# Patient Record
Sex: Male | Born: 1985
Health system: Southern US, Community
[De-identification: ages and names within clinical notes are randomized; demographics above are authoritative.]

## PROBLEM LIST (undated history)

## (undated) ENCOUNTER — Emergency Department (HOSPITAL_COMMUNITY): Disposition: A | Discharge status: Home / Self Care | Payer: BC Managed Care – PPO

## (undated) DIAGNOSIS — T7840XA Allergy, unspecified, initial encounter: Secondary | ICD-10-CM

## (undated) DIAGNOSIS — F32A Depression, unspecified: Secondary | ICD-10-CM

## (undated) DIAGNOSIS — F329 Major depressive disorder, single episode, unspecified: Secondary | ICD-10-CM

## (undated) DIAGNOSIS — F419 Anxiety disorder, unspecified: Secondary | ICD-10-CM

## (undated) HISTORY — DX: Allergy, unspecified, initial encounter: T78.40XA

---
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
## 2006-12-15 ENCOUNTER — Emergency Department (HOSPITAL_COMMUNITY): Admission: EM | Admit: 2006-12-15 | Discharge: 2006-12-15 | Payer: Self-pay | Admitting: Emergency Medicine

## 2006-12-30 ENCOUNTER — Emergency Department (HOSPITAL_COMMUNITY): Admission: EM | Admit: 2006-12-30 | Discharge: 2006-12-30 | Payer: Self-pay | Admitting: Emergency Medicine

## 2007-02-04 ENCOUNTER — Emergency Department (HOSPITAL_COMMUNITY): Admission: EM | Admit: 2007-02-04 | Discharge: 2007-02-04 | Payer: Self-pay | Admitting: Emergency Medicine

## 2011-07-20 ENCOUNTER — Ambulatory Visit (INDEPENDENT_AMBULATORY_CARE_PROVIDER_SITE_OTHER): Payer: BC Managed Care – PPO

## 2011-07-20 DIAGNOSIS — R0609 Other forms of dyspnea: Secondary | ICD-10-CM

## 2011-07-20 DIAGNOSIS — R079 Chest pain, unspecified: Secondary | ICD-10-CM

## 2011-07-20 DIAGNOSIS — R0989 Other specified symptoms and signs involving the circulatory and respiratory systems: Secondary | ICD-10-CM

## 2011-10-25 ENCOUNTER — Ambulatory Visit (INDEPENDENT_AMBULATORY_CARE_PROVIDER_SITE_OTHER): Payer: BC Managed Care – PPO | Admitting: Internal Medicine

## 2011-10-25 ENCOUNTER — Telehealth: Payer: Self-pay

## 2011-10-25 ENCOUNTER — Telehealth: Payer: Self-pay | Admitting: Family Medicine

## 2011-10-25 VITALS — BP 123/80 | HR 90 | Temp 99.0°F | Resp 16 | Ht 70.75 in | Wt 195.8 lb

## 2011-10-25 DIAGNOSIS — J039 Acute tonsillitis, unspecified: Secondary | ICD-10-CM

## 2011-10-25 DIAGNOSIS — J209 Acute bronchitis, unspecified: Secondary | ICD-10-CM

## 2011-10-25 DIAGNOSIS — Z789 Other specified health status: Secondary | ICD-10-CM

## 2011-10-25 DIAGNOSIS — J40 Bronchitis, not specified as acute or chronic: Secondary | ICD-10-CM

## 2011-10-25 DIAGNOSIS — J45909 Unspecified asthma, uncomplicated: Secondary | ICD-10-CM | POA: Insufficient documentation

## 2011-10-25 LAB — POCT RAPID STREP A (OFFICE): Rapid Strep A Screen: POSITIVE — AB

## 2011-10-25 MED ORDER — HYDROCODONE-ACETAMINOPHEN 7.5-500 MG/15ML PO SOLN: 5.0000 mL | Freq: Four times a day (QID) | ORAL | Status: AC | PRN

## 2011-10-25 MED ORDER — AZITHROMYCIN 500 MG PO TABS: 500.0000 mg | ORAL_TABLET | Freq: Every day | ORAL | Status: AC

## 2011-10-25 NOTE — Telephone Encounter (Signed)
Lortab elixir printed and not given to patient, rx called into [facility].

## 2011-10-25 NOTE — Progress Notes (Signed)
  Subjective:    Patient ID: Brad Davidson, male    DOB: 1986/01/29, 26 y.o.   MRN: [id number]  HPI Very ST, purulent disch and bleeding from tonsills Has cough and hx of asthma  Review of Systems See list    Objective:   Physical Exam Exudative hemorragic tonsillitis Lungs few rhonchi Results for orders placed in visit on 10/25/11  POCT RAPID STREP A (OFFICE)      Component Value Range   Rapid Strep A Screen Positive (*) Negative           Assessment & Plan:  Zithromax 500mg  Lortab elixir 6 oz

## 2011-10-25 NOTE — Patient Instructions (Signed)
Tonsillitis Tonsils are lumps of lymphoid tissues at the back of the throat. Each tonsil has 20 crevices (crypts). Tonsils help fight nose and throat infections and keep infection from spreading to other parts of the body for the first 18 months of life. Tonsillitis is an infection of the throat that causes the tonsils to become red, tender, and swollen. CAUSES Sudden and, if treated, temporary (acute) tonsillitis is usually caused by infection with streptococcal bacteria. Long lasting (chronic) tonsillitis occurs when the crypts of the tonsils become filled with pieces of food and bacteria, which makes it easy for the tonsils to become constantly infected. SYMPTOMS  Symptoms of tonsillitis include:  A sore throat.   White patches on the tonsils.   Fever.   Tiredness.  DIAGNOSIS Tonsillitis can be diagnosed through a physical exam. Diagnosis can be confirmed with the results of lab tests, including a throat culture. TREATMENT  The goals of tonsillitis treatment include the reduction of the severity and duration of symptoms, prevention of associated conditions, and prevention of disease transmission. Tonsillitis caused by bacteria can be treated with antibiotics. Usually, treatment with antibiotics is started before the cause of the tonsillitis is known. However, if it is determined that the cause is not bacterial, antibiotics will not treat the tonsillitis. If attacks of tonsillitis are severe and frequent, your caregiver may recommend surgery to remove the tonsils (tonsillectomy). HOME CARE INSTRUCTIONS   Rest as much as possible and get plenty of sleep.   Drink plenty of fluids. While the throat is very sore, eat soft foods or liquids, such as sherbet, soups, or instant breakfast drinks.   Eat frozen ice pops.   Older children and adults may gargle with a warm or cold liquid to help soothe the throat. Mix 1 teaspoon of salt in 1 cup of water.   Other family members who also develop a  sore throat or fever should have a medical exam or throat culture.   Only take over-the-counter or prescription medicines for pain, discomfort, or fever as directed by your caregiver.   If you are given antibiotics, take them as directed. Finish them even if you start to feel better.  SEEK MEDICAL CARE IF:   Your baby is older than 3 months with a rectal temperature of 100.5 F (38.1 C) or higher for more than 1 day.   Large, tender lumps develop in your neck.   A rash develops.   Green, yellow-brown, or bloody substance is coughed up.   You are unable to swallow liquids or food for 24 hours.   Your child is unable to swallow food or liquids for 12 hours.  SEEK IMMEDIATE MEDICAL CARE IF:   You develop any new symptoms such as vomiting, severe headache, stiff neck, chest pain, or trouble breathing or swallowing.   You have severe throat pain along with drooling or voice changes.   You have severe pain, unrelieved with recommended medications.   You are unable to fully open the mouth.   You develop redness, swelling, or severe pain anywhere in the neck.   You have a fever.   Your baby is older than 3 months with a rectal temperature of 102 F (38.9 C) or higher.   Your baby is 12 months old or younger with a rectal temperature of 100.4 F (38 C) or higher.  MAKE SURE YOU:   Understand these instructions.   Will watch your condition.   Will get help right away if you are not  watch your condition.   Will get help right away if you are not doing well or get worse.  Document Released: 05/02/2005 Document Revised: 07/12/2011 Document Reviewed: 09/28/2010  [employer] Patient Information 2012 [employer].

## 2011-11-08 ENCOUNTER — Emergency Department (HOSPITAL_COMMUNITY)
Admission: EM | Admit: 2011-11-08 | Discharge: 2011-11-09 | Disposition: A | Discharge status: Home / Self Care | Payer: BC Managed Care – PPO | Attending: Emergency Medicine | Admitting: Emergency Medicine

## 2011-11-08 ENCOUNTER — Emergency Department (HOSPITAL_COMMUNITY): Payer: BC Managed Care – PPO

## 2011-11-08 ENCOUNTER — Encounter (HOSPITAL_COMMUNITY): Payer: Self-pay | Admitting: *Deleted

## 2011-11-08 DIAGNOSIS — R0609 Other forms of dyspnea: Secondary | ICD-10-CM | POA: Insufficient documentation

## 2011-11-08 DIAGNOSIS — K5289 Other specified noninfective gastroenteritis and colitis: Secondary | ICD-10-CM | POA: Insufficient documentation

## 2011-11-08 DIAGNOSIS — R0602 Shortness of breath: Secondary | ICD-10-CM | POA: Insufficient documentation

## 2011-11-08 DIAGNOSIS — K5900 Constipation, unspecified: Secondary | ICD-10-CM | POA: Insufficient documentation

## 2011-11-08 DIAGNOSIS — J45909 Unspecified asthma, uncomplicated: Secondary | ICD-10-CM | POA: Insufficient documentation

## 2011-11-08 DIAGNOSIS — K529 Noninfective gastroenteritis and colitis, unspecified: Secondary | ICD-10-CM

## 2011-11-08 DIAGNOSIS — R0989 Other specified symptoms and signs involving the circulatory and respiratory systems: Secondary | ICD-10-CM | POA: Insufficient documentation

## 2011-11-08 DIAGNOSIS — R1110 Vomiting, unspecified: Secondary | ICD-10-CM | POA: Insufficient documentation

## 2011-11-08 LAB — CBC
MCHC: 34.7 g/dL (ref 30.0–36.0)
RBC: 5.45 MIL/uL (ref 4.22–5.81)
RDW: 12.7 % (ref 11.5–15.5)

## 2011-11-08 LAB — DIFFERENTIAL
Basophils Absolute: 0 10*3/uL (ref 0.0–0.1)
Eosinophils Absolute: 0.5 10*3/uL (ref 0.0–0.7)
Eosinophils Relative: 3 % (ref 0–5)
Monocytes Absolute: 0.8 10*3/uL (ref 0.1–1.0)
Monocytes Relative: 5 % (ref 3–12)
Neutro Abs: 12.6 10*3/uL — ABNORMAL HIGH (ref 1.7–7.7)
Neutrophils Relative %: 79 % — ABNORMAL HIGH (ref 43–77)

## 2011-11-08 LAB — COMPREHENSIVE METABOLIC PANEL
ALT: 26 U/L (ref 0–53)
AST: 20 U/L (ref 0–37)
Albumin: 4.5 g/dL (ref 3.5–5.2)
Alkaline Phosphatase: 95 U/L (ref 39–117)
CO2: 23 mEq/L (ref 19–32)
Chloride: 102 mEq/L (ref 96–112)
GFR calc Af Amer: >90 mL/min (ref >90)
GFR calc non Af Amer: >90 mL/min (ref >90)
Sodium: 137 mEq/L (ref 135–145)
Total Bilirubin: 0.3 mg/dL (ref 0.3–1.2)

## 2011-11-08 LAB — LIPASE, BLOOD: Lipase: 15 U/L (ref 11–59)

## 2011-11-08 MED ORDER — ONDANSETRON HCL 4 MG/2ML IJ SOLN
4.0000 mg | Freq: Once | INTRAMUSCULAR | Status: AC
  Administered 2011-11-08: 4 mg via INTRAVENOUS
  Filled 2011-11-08: qty 2

## 2011-11-08 MED ORDER — SODIUM CHLORIDE 0.9 % IV SOLN
Freq: Once | INTRAVENOUS | Status: AC
Start: 2011-11-08 — End: 2011-11-09
  Administered 2011-11-09: via INTRAVENOUS

## 2011-11-08 MED ORDER — SODIUM CHLORIDE 0.9 % IV BOLUS (SEPSIS)
1000.0000 mL | Freq: Once | INTRAVENOUS | Status: AC
  Administered 2011-11-08: 1000 mL via INTRAVENOUS

## 2011-11-08 NOTE — ED Notes (Signed)
Pt was triaged by Diane,RN not Carmen,NT

## 2011-11-08 NOTE — ED Notes (Signed)
Pt states he has been vomiting after eating for the past 3days.

## 2011-11-08 NOTE — ED Provider Notes (Signed)
History     CSN: [id number]  Arrival date & time 11/08/11  2055   First MD Initiated Contact with Patient 11/08/11 2307      Chief Complaint  Patient presents with  . Emesis  . Shortness of Breath    (Consider location/radiation/quality/duration/timing/severity/associated sxs/prior treatment) Patient is a 26 y.o. male presenting with vomiting and shortness of breath. The history is provided by the patient.  Emesis   Shortness of Breath  Associated symptoms include shortness of breath.   patient here with dyspnea associated with eating for 3 days. He has had emesis which has been nonbilious. No fever or diarrhea. Has noted increased constipation. Denies food impaction symptoms. Symptoms worse when he tries to take larger amounts of food. No prior history of same. No medications taken for this. Nothing makes her symptoms better  Past Medical History  Diagnosis Date  . Allergy   . Asthma     History reviewed. No pertinent past surgical history.  No family history on file.  History  Substance Use Topics  . Smoking status: Never Smoker   . Smokeless tobacco: Not on file  . Alcohol Use: Yes      Review of Systems  Respiratory: Positive for shortness of breath.   Gastrointestinal: Positive for vomiting.  All other systems reviewed and are negative.    Allergies  Penicillins  Home Medications   Current Outpatient Rx  Name Route Sig Dispense Refill  . ALBUTEROL SULFATE HFA 108 (90 BASE) MCG/ACT IN AERS Inhalation Inhale 2 puffs into the lungs every 6 (six) hours as needed. For shortness of breath.    Marland Kitchen HYDROCOD POLST-CPM POLST ER 10-8 MG/5ML PO LQCR Oral Take 5 mLs by mouth every 12 (twelve) hours as needed. For pain related to cough/cough suppressant.    Arloa Koh FAST-MAX DM MAX PO Oral Take 15 mLs by mouth every 8 (eight) hours as needed. For congestion.    Marland Kitchen DOCUSATE SODIUM 100 MG PO CAPS Oral Take 100 mg by mouth daily as needed. For stool softener.    Marland Kitchen  POLYETHYLENE GLYCOL 3350 PO PACK Oral Take 17 g by mouth daily as needed. For constipation.      BP 154/81  Pulse 93  Temp(Src) 98.4 F (36.9 C) (Oral)  Resp 16  SpO2 100%  Physical Exam  Nursing note and vitals reviewed. Constitutional: He is oriented to person, place, and time. He appears well-developed and well-nourished.  Non-toxic appearance. No distress.  HENT:  Head: Normocephalic and atraumatic.  Eyes: Conjunctivae, EOM and lids are normal. Pupils are equal, round, and reactive to light.  Neck: Normal range of motion. Neck supple. No tracheal deviation present. No mass present.  Cardiovascular: Normal rate, regular rhythm and normal heart sounds.  Exam reveals no gallop.   No murmur heard. Pulmonary/Chest: Effort normal and breath sounds normal. No stridor. No respiratory distress. He has no decreased breath sounds. He has no wheezes. He has no rhonchi. He has no rales.  Abdominal: Soft. Normal appearance and bowel sounds are normal. He exhibits no distension. There is no tenderness. There is no rigidity, no rebound, no guarding and no CVA tenderness.  Musculoskeletal: Normal range of motion. He exhibits no edema and no tenderness.  Neurological: He is alert and oriented to person, place, and time. He has normal strength. No cranial nerve deficit or sensory deficit. GCS eye subscore is 4. GCS verbal subscore is 5. GCS motor subscore is 6.  Skin: Skin is warm and dry. No  abrasion and no rash noted.  Psychiatric: He has a normal mood and affect. His speech is normal and behavior is normal.    ED Course  Procedures (including critical care time)  Labs Reviewed - No data to display No results found.   No diagnosis found.    MDM  Patient given IV fluids and medications. Suspect that he has gastroenteritis. He feels better after the medications and repeat abdominal exam is negative for a surgical process. He'll be discharged        Toy Baker, MD 11/09/11 [phone]

## 2011-11-08 NOTE — Progress Notes (Addendum)
Pt presents with n/v x 2 days, unable to keep any food or liquids down.  Pt states that he also has sob with the n/v. Pt recently treated with PNA . Denies any pain at time. Denies Diarrhea.

## 2011-11-08 NOTE — ED Notes (Signed)
Pt out for X ray.

## 2011-11-08 NOTE — ED Notes (Signed)
Pt c/o vomiting x2hrs, now having SOB

## 2011-11-09 LAB — URINALYSIS, ROUTINE W REFLEX MICROSCOPIC
Bilirubin Urine: NEGATIVE
Glucose, UA: NEGATIVE mg/dL
Leukocytes, UA: NEGATIVE

## 2011-11-09 MED ORDER — ONDANSETRON 8 MG PO TBDP: 8.0000 mg | ORAL_TABLET | Freq: Three times a day (TID) | ORAL | Status: AC | PRN

## 2011-11-09 NOTE — Discharge Instructions (Signed)
Abdominal Pain Abdominal pain can be caused by many things. Your caregiver decides the seriousness of your pain by an examination and possibly blood tests and X-rays. Many cases can be observed and treated at home. Most abdominal pain is not caused by a disease and will probably improve without treatment. However, in many cases, more time must pass before a clear cause of the pain can be found. Before that point, it may not be known if you need more testing, or if hospitalization or surgery is needed. HOME CARE INSTRUCTIONS   Do not take laxatives unless directed by your caregiver.   Take pain medicine only as directed by your caregiver.   Only take over-the-counter or prescription medicines for pain, discomfort, or fever as directed by your caregiver.   Try a clear liquid diet (broth, tea, or water) for as long as directed by your caregiver. Slowly move to a bland diet as tolerated.  SEEK IMMEDIATE MEDICAL CARE IF:   The pain does not go away.   You have a fever.   You keep throwing up (vomiting).   The pain is felt only in portions of the abdomen. Pain in the right side could possibly be appendicitis. In an adult, pain in the left lower portion of the abdomen could be colitis or diverticulitis.   You pass bloody or black tarry stools.  MAKE SURE YOU:   Understand these instructions.   Will watch your condition.   Will get help right away if you are not doing well or get worse.  Document Released: 05/02/2005 Document Revised: 07/12/2011 Document Reviewed: 03/10/2008 [facility] Patient Information 2012 [place], [place].Viral Gastroenteritis Viral gastroenteritis is also known as stomach flu. This condition affects the stomach and intestinal tract. It can cause sudden diarrhea and vomiting. The illness typically lasts 3 to 8 days. Most people develop an immune response that eventually gets rid of the virus. While this natural response develops, the virus can make you quite ill. CAUSES    Many different viruses can cause gastroenteritis, such as rotavirus or noroviruses. You can catch one of these viruses by consuming contaminated food or water. You may also catch a virus by sharing utensils or other personal items with an infected person or by touching a contaminated surface. SYMPTOMS  The most common symptoms are diarrhea and vomiting. These problems can cause a severe loss of body fluids (dehydration) and a body salt (electrolyte) imbalance. Other symptoms may include:  Fever.   Headache.   Fatigue.   Abdominal pain.  DIAGNOSIS  Your caregiver can usually diagnose viral gastroenteritis based on your symptoms and a physical exam. A stool sample may also be taken to test for the presence of viruses or other infections. TREATMENT  This illness typically goes away on its own. Treatments are aimed at rehydration. The most serious cases of viral gastroenteritis involve vomiting so severely that you are not able to keep fluids down. In these cases, fluids must be given through an intravenous line (IV). HOME CARE INSTRUCTIONS   Drink enough fluids to keep your urine clear or pale yellow. Drink small amounts of fluids frequently and increase the amounts as tolerated.   Ask your caregiver for specific rehydration instructions.   Avoid:   Foods high in sugar.   Alcohol.   Carbonated drinks.   Tobacco.   Juice.   Caffeine drinks.   Extremely hot or cold fluids.   Fatty, greasy foods.   Too much intake of anything at one time.  Dairy products until 24 to 48 hours after diarrhea stops.   You may consume probiotics. Probiotics are active cultures of beneficial bacteria. They may lessen the amount and number of diarrheal stools in adults. Probiotics can be found in yogurt with active cultures and in supplements.   Wash your hands well to avoid spreading the virus.   Only take over-the-counter or prescription medicines for pain, discomfort, or fever as directed by  your caregiver. Do not give aspirin to children. Antidiarrheal medicines are not recommended.   Ask your caregiver if you should continue to take your regular prescribed and over-the-counter medicines.   Keep all follow-up appointments as directed by your caregiver.  SEEK IMMEDIATE MEDICAL CARE IF:   You are unable to keep fluids down.   You do not urinate at least once every 6 to 8 hours.   You develop shortness of breath.   You notice blood in your stool or vomit. This may look like coffee grounds.   You have abdominal pain that increases or is concentrated in one small area (localized).   You have persistent vomiting or diarrhea.   You have a fever.   The patient is a child younger than 3 months, and he or she has a fever.   The patient is a child older than 3 months, and he or she has a fever and persistent symptoms.   The patient is a child older than 3 months, and he or she has a fever and symptoms suddenly get worse.   The patient is a baby, and he or she has no tears when crying.  MAKE SURE YOU:   Understand these instructions.   Will watch your condition.   Will get help right away if you are not doing well or get worse.  Document Released: 07/23/2005 Document Revised: 07/12/2011 Document Reviewed: 05/09/2011 [facility] Patient Information 2012 [place], [place].

## 2011-11-10 LAB — URINE CULTURE
Colony Count: NO GROWTH
Culture: NO GROWTH

## 2011-11-16 ENCOUNTER — Ambulatory Visit (INDEPENDENT_AMBULATORY_CARE_PROVIDER_SITE_OTHER): Payer: BC Managed Care – PPO | Admitting: Family Medicine

## 2011-11-16 VITALS — BP 122/88 | HR 106 | Temp 97.8°F | Resp 16 | Ht 69.5 in | Wt 184.8 lb

## 2011-11-16 DIAGNOSIS — R1110 Vomiting, unspecified: Secondary | ICD-10-CM

## 2011-11-16 DIAGNOSIS — B9789 Other viral agents as the cause of diseases classified elsewhere: Secondary | ICD-10-CM

## 2011-11-16 DIAGNOSIS — R112 Nausea with vomiting, unspecified: Secondary | ICD-10-CM

## 2011-11-16 LAB — POCT CBC
Granulocyte percent: 79.4 %G (ref 37–80)
HCT, POC: 46 % (ref 43.5–53.7)
Hemoglobin: 15.6 g/dL (ref 14.1–18.1)
Lymph, poc: 2.1 (ref 0.6–3.4)
MCH, POC: 28.9 pg (ref 27–31.2)
MCHC: 33.9 g/dL (ref 31.8–35.4)
MCV: 85.3 fL (ref 80–97)
MID (cbc): 0.5 (ref 0–0.9)
MPV: 10.3 fL (ref 0–99.8)
POC Granulocyte: 10.1 — AB (ref 2–6.9)
POC LYMPH PERCENT: 16.3 %L (ref 10–50)
POC MID %: 4.3 %M (ref 0–12)
Platelet Count, POC: 261 10*3/uL (ref 142–424)
RBC: 5.39 M/uL (ref 4.69–6.13)
RDW, POC: 13.1 %
WBC: 12.7 10*3/uL — AB (ref 4.6–10.2)

## 2011-11-16 MED ORDER — ONDANSETRON 8 MG PO TBDP: 8.0000 mg | ORAL_TABLET | Freq: Three times a day (TID) | ORAL | Status: AC | PRN

## 2011-11-16 MED ORDER — SODIUM CHLORIDE 0.9 % IV SOLN: 4.0000 mg | Freq: Once | INTRAVENOUS | Status: DC

## 2011-11-16 NOTE — Progress Notes (Signed)
26 yo man with vomiting daily for 5 days except for Wednesday when he thought the problem was resolving.  Has been sticking to fruits. Associated with abdominal crampy pain with some sharp pains into axillae. Also has felt hot  Unemployed dispatcher  O:  Lying prostrate in exam room appearing exhausted with girlfriend present. Intermittently wretching HEENT:  No icterus, grossly normal Neck supple Chest:  Clear Heart:  Reg, no murmur Abdomen:  Soft, hyperactive BS, nontender, no HSM or mass Skin:  Pilaris keratitis on shoulders  A:

## 2011-11-16 NOTE — Patient Instructions (Signed)
Nausea and Vomiting  Nausea is a sick feeling that often comes before throwing up (vomiting). Vomiting is a reflex where stomach contents come out of your mouth. Vomiting can cause severe loss of body fluids (dehydration). Children and elderly adults can become dehydrated quickly, especially if they also have diarrhea. Nausea and vomiting are symptoms of a condition or disease. It is important to find the cause of your symptoms.  CAUSES    Direct irritation of the stomach lining. This irritation can result from increased acid production (gastroesophageal reflux disease), infection, food poisoning, taking certain medicines (such as nonsteroidal anti-inflammatory drugs), alcohol use, or tobacco use.   Signals from the brain.These signals could be caused by a headache, heat exposure, an inner ear disturbance, increased pressure in the brain from injury, infection, a tumor, or a concussion, pain, emotional stimulus, or metabolic problems.   An obstruction in the gastrointestinal tract (bowel obstruction).   Illnesses such as diabetes, hepatitis, gallbladder problems, appendicitis, kidney problems, cancer, sepsis, atypical symptoms of a heart attack, or eating disorders.   Medical treatments such as chemotherapy and radiation.   Receiving medicine that makes you sleep (general anesthetic) during surgery.  DIAGNOSIS  Your caregiver may ask for tests to be done if the problems do not improve after a few days. Tests may also be done if symptoms are severe or if the reason for the nausea and vomiting is not clear. Tests may include:   Urine tests.   Blood tests.   Stool tests.   Cultures (to look for evidence of infection).   X-rays or other imaging studies.  Test results can help your caregiver make decisions about treatment or the need for additional tests.  TREATMENT  You need to stay well hydrated. Drink frequently but in small amounts.You may wish to drink water, sports drinks, clear broth, or eat frozen  ice pops or gelatin dessert to help stay hydrated.When you eat, eating slowly may help prevent nausea.There are also some antinausea medicines that may help prevent nausea.  HOME CARE INSTRUCTIONS    Take all medicine as directed by your caregiver.   If you do not have an appetite, do not force yourself to eat. However, you must continue to drink fluids.   If you have an appetite, eat a normal diet unless your caregiver tells you differently.   Eat a variety of complex carbohydrates (rice, wheat, potatoes, bread), lean meats, yogurt, fruits, and vegetables.   Avoid high-fat foods because they are more difficult to digest.   Drink enough water and fluids to keep your urine clear or pale yellow.   If you are dehydrated, ask your caregiver for specific rehydration instructions. Signs of dehydration may include:   Severe thirst.   Dry lips and mouth.   Dizziness.   Dark urine.   Decreasing urine frequency and amount.   Confusion.   Rapid breathing or pulse.  SEEK IMMEDIATE MEDICAL CARE IF:    You have blood or brown flecks (like coffee grounds) in your vomit.   You have black or bloody stools.   You have a severe headache or stiff neck.   You are confused.   You have severe abdominal pain.   You have chest pain or trouble breathing.   You do not urinate at least once every 8 hours.   You develop cold or clammy skin.   You continue to vomit for longer than 24 to 48 hours.   You have a fever.  MAKE SURE YOU:      Understand these instructions.   Will watch your condition.   Will get help right away if you are not doing well or get worse.  Document Released: 07/23/2005 Document Revised: 07/12/2011 Document Reviewed: 12/20/2010  [employer] Patient Information 2012 [employer].

## 2011-12-06 ENCOUNTER — Ambulatory Visit (INDEPENDENT_AMBULATORY_CARE_PROVIDER_SITE_OTHER): Payer: BC Managed Care – PPO | Admitting: Family Medicine

## 2011-12-06 ENCOUNTER — Encounter: Payer: Self-pay | Admitting: Internal Medicine

## 2011-12-06 VITALS — BP 111/72 | HR 116 | Temp 97.8°F | Resp 16 | Ht 70.0 in | Wt 183.0 lb

## 2011-12-06 DIAGNOSIS — F411 Generalized anxiety disorder: Secondary | ICD-10-CM

## 2011-12-06 DIAGNOSIS — F419 Anxiety disorder, unspecified: Secondary | ICD-10-CM

## 2011-12-06 DIAGNOSIS — R05 Cough: Secondary | ICD-10-CM

## 2011-12-06 DIAGNOSIS — R1110 Vomiting, unspecified: Secondary | ICD-10-CM

## 2011-12-06 LAB — COMPREHENSIVE METABOLIC PANEL
ALT: 19 U/L (ref 0–53)
BUN: 6 mg/dL (ref 6–23)
CO2: 24 mEq/L (ref 19–32)
Chloride: 103 mEq/L (ref 96–112)
Sodium: 138 mEq/L (ref 135–145)
Total Protein: 8.1 g/dL (ref 6.0–8.3)

## 2011-12-06 LAB — POCT RAPID STREP A (OFFICE): Rapid Strep A Screen: NEGATIVE

## 2011-12-06 LAB — POCT CBC
Granulocyte percent: 64.7 %G (ref 37–80)
HCT, POC: 48.9 % (ref 43.5–53.7)
Hemoglobin: 16.2 g/dL (ref 14.1–18.1)
Lymph, poc: 1.9 (ref 0.6–3.4)
MCHC: 33.1 g/dL (ref 31.8–35.4)
MPV: 11.2 fL (ref 0–99.8)
POC Granulocyte: 4.2 (ref 2–6.9)
Platelet Count, POC: 291 10*3/uL (ref 142–424)
RBC: 5.64 M/uL (ref 4.69–6.13)
RDW, POC: 13.2 %

## 2011-12-06 LAB — POCT URINALYSIS DIPSTICK
Bilirubin, UA: NEGATIVE
Glucose, UA: NEGATIVE
Ketones, UA: 15
Leukocytes, UA: NEGATIVE
Protein, UA: NEGATIVE
Spec Grav, UA: 1.025

## 2011-12-06 LAB — POCT UA - MICROSCOPIC ONLY
Crystals, Ur, HPF, POC: NEGATIVE
Mucus, UA: NEGATIVE
Yeast, UA: NEGATIVE

## 2011-12-06 MED ORDER — BECLOMETHASONE DIPROPIONATE 40 MCG/ACT IN AERS: 2.0000 | INHALATION_SPRAY | Freq: Two times a day (BID) | RESPIRATORY_TRACT | Status: DC

## 2011-12-06 MED ORDER — ZOLPIDEM TARTRATE 10 MG PO TABS: ORAL_TABLET | ORAL | Status: DC

## 2011-12-06 MED ORDER — ALBUTEROL SULFATE HFA 108 (90 BASE) MCG/ACT IN AERS: 2.0000 | INHALATION_SPRAY | Freq: Four times a day (QID) | RESPIRATORY_TRACT | Status: DC | PRN

## 2011-12-06 MED ORDER — SERTRALINE HCL 50 MG PO TABS: ORAL_TABLET | ORAL | Status: DC

## 2011-12-06 NOTE — Patient Instructions (Addendum)
Use your Qvar inhaler as directed for a week and your albuterol inhaler as needed.  Return to our clinic if your breathing symptoms worse. Start taking your Sertraline every morning with some food, be sure and take a 30 minute walk everyday.  Use the sleep medicine if needed to get a good night's sleep. Return for an office visit with Dr. Audria Nine or another provider in 2-4 weeks.       Cough, Adult  A cough is a reflex that helps clear your throat and airways. It can help heal the body or may be a reaction to an irritated airway. A cough may only last 2 or 3 weeks (acute) or may last more than 8 weeks (chronic).  CAUSES Acute cough:  Viral or bacterial infections.  Chronic cough:  Infections.   Allergies.   Asthma.   Post-nasal drip.   Smoking.   Heartburn or acid reflux.   Some medicines.   Chronic lung problems (COPD).   Cancer.  SYMPTOMS   Cough.   Fever.   Chest pain.   Increased breathing rate.   High-pitched whistling sound when breathing (wheezing).   Colored mucus that you cough up (sputum).  TREATMENT   A bacterial cough may be treated with antibiotic medicine.   A viral cough must run its course and will not respond to antibiotics.   Your caregiver may recommend other treatments if you have a chronic cough.  HOME CARE INSTRUCTIONS   Only take over-the-counter or prescription medicines for pain, discomfort, or fever as directed by your caregiver. Use cough suppressants only as directed by your caregiver.   Use a cold steam vaporizer or humidifier in your bedroom or home to help loosen secretions.   Sleep in a semi-upright position if your cough is worse at night.   Rest as needed.   Stop smoking if you smoke.  SEEK IMMEDIATE MEDICAL CARE IF:   You have pus in your sputum.   Your cough starts to worsen.   You cannot control your cough with suppressants and are losing sleep.   You begin coughing up blood.   You have difficulty  breathing.   You develop pain which is getting worse or is uncontrolled with medicine.   You have a fever.  MAKE SURE YOU:   Understand these instructions.   Will watch your condition.   Will get help right away if you are not doing well or get worse.  Document Released: 01/19/2011 Document Revised: 07/12/2011 Document Reviewed: 01/19/2011 [facility] Patient Information 2012 [place], [place].

## 2011-12-06 NOTE — Progress Notes (Signed)
  Subjective:    Patient ID: Brad Davidson, male    DOB: 1986-05-29, 26 y.o.   MRN: [id number]  HPI  Brad Davidson comes in today complaining of a 5 day history of cold symptoms and cough.  He feels he cannot take a deep breath, he has been using his albuterol inhaler.  He also states he is vomiting, last episode this am.  He is anxious about his health and states he has lost 35 pounds over the last few months.  He was referred to Dr. Elnoria Howard but said he had expensive vet bills that prevented him from going.  He lives with his grandfather and has not worked for several years.  He states he is anxious around other people.  He has tried Paxil in the past but did not like the way it made him feel.  He does admit to having had panic attacks in the past.  Lately he has trouble sleeping and sleeps about 4 hours a night.    Review of Systems  Constitutional: Positive for activity change and unexpected weight change. Negative for fever and chills.  HENT: Positive for congestion and rhinorrhea.   Eyes: Negative.   Respiratory: Positive for cough and wheezing.   Cardiovascular: Negative.   Gastrointestinal: Positive for vomiting and constipation.  Genitourinary: Negative.   Musculoskeletal: Negative.   Neurological: Positive for light-headedness.  Hematological: Negative.   Psychiatric/Behavioral: Positive for sleep disturbance. The patient is nervous/anxious.        Objective:   Physical Exam  Vitals reviewed. Constitutional: He is oriented to person, place, and time. He appears well-developed and well-nourished.  HENT:  Head: Normocephalic.  Eyes: Conjunctivae are normal.  Neck: No thyromegaly present.  Cardiovascular: Normal rate, regular rhythm and normal heart sounds.   Pulmonary/Chest: Effort normal. He has wheezes.       Rare expiratory wheezes, these cleared after albuterol nebulizer treatment.  Abdominal: Soft. Bowel sounds are normal. He exhibits no mass. There is tenderness. There is no  rebound and no guarding.  Musculoskeletal: Normal range of motion.  Lymphadenopathy:    He has no cervical adenopathy.  Neurological: He is alert and oriented to person, place, and time.  Skin: Skin is warm and dry. No rash noted.  Psychiatric: His behavior is normal. Thought content normal.       Pt is anxious          Assessment & Plan:  CBC and U/A WNL and RS negative.   Cough/RAD:  Much improved after albuterol nebulizer treatment.  Qvar and albuterol inhalers to use over the next 2 weeks, may stop Qvar if breathing normal without it's use.  If he is not much better  In 2 days consider antibiotic treatment. Anxiety:  Discussed strategies:  Pt agrees to trial of Sertraline 50 for one week, increase to 100 mg in am with food 2nd week.  Walk 30 minutes daily outside for exercise.  Use Ambien sparingly for sleep, try and get 8 hours of rest.  Recheck in 2-4 weeks with Dr. Audria Nine or another health care provider.  AVS printed and given pt.  Dr. Hal Hope will be presented with this case for pending CMP labs.

## 2011-12-23 ENCOUNTER — Ambulatory Visit: Payer: BC Managed Care – PPO

## 2011-12-23 ENCOUNTER — Ambulatory Visit (INDEPENDENT_AMBULATORY_CARE_PROVIDER_SITE_OTHER): Payer: BC Managed Care – PPO | Admitting: Family Medicine

## 2011-12-23 VITALS — BP 120/88 | HR 105 | Temp 98.6°F | Resp 16 | Ht 71.0 in | Wt 180.0 lb

## 2011-12-23 DIAGNOSIS — R0602 Shortness of breath: Secondary | ICD-10-CM

## 2011-12-23 MED ORDER — IPRATROPIUM BROMIDE 0.02 % IN SOLN
0.5000 mg | Freq: Once | RESPIRATORY_TRACT | Status: AC
  Administered 2011-12-23: 0.5 mg via RESPIRATORY_TRACT

## 2011-12-23 MED ORDER — ALBUTEROL SULFATE (5 MG/ML) 0.5% IN NEBU
2.5000 mg | INHALATION_SOLUTION | Freq: Once | RESPIRATORY_TRACT | Status: AC
  Administered 2011-12-23: 2.5 mg via RESPIRATORY_TRACT

## 2011-12-23 MED ORDER — PREDNISONE 20 MG PO TABS: ORAL_TABLET | ORAL | Status: DC

## 2011-12-23 MED ORDER — AZITHROMYCIN 250 MG PO TABS: ORAL_TABLET | ORAL | Status: AC

## 2011-12-23 NOTE — Progress Notes (Signed)
  Subjective:    Patient ID: Brad Davidson, male    DOB: 02/14/1986, 26 y.o.   MRN: [id number]  HPI 26 yo male seen here twice last 2 months.  Last visit was for cough/sob.  Given neb and qvar and albuterol.  Says neb made him feel much better for a day or two and since has felt badly again.  SOB - like he can't get good deep breath, and heart racing even with just walking.  (checks pulse.  Says around 70-80 at rest, 110 when walking, "much higher" when exercising).  Has been out of shape and trying to resume exercise recently.  Overall sob improving, but it is always worse while he is exercising.   Subjective fevers, chills. Cough productive of phlegm at times.   History of asthma.  Used to be on advair but stopped it when he felt better.  No taking the zoloft.  Only took it for 2-3 days and it made him feel bad.    Review of Systems Negative except as per HPI     Objective:   Physical Exam  Constitutional: He appears well-developed. No distress.  HENT:  Right Ear: Tympanic membrane, external ear and ear canal normal. Tympanic membrane is not injected, not scarred, not perforated, not erythematous, not retracted and not bulging.  Left Ear: Tympanic membrane, external ear and ear canal normal. Tympanic membrane is not injected, not scarred, not perforated, not erythematous, not retracted and not bulging.  Nose: No mucosal edema or rhinorrhea. Right sinus exhibits no maxillary sinus tenderness and no frontal sinus tenderness. Left sinus exhibits no maxillary sinus tenderness and no frontal sinus tenderness.  Mouth/Throat: Uvula is midline, oropharynx is clear and moist and mucous membranes are normal. No oropharyngeal exudate or tonsillar abscesses.  Cardiovascular: Normal rate, regular rhythm, normal heart sounds and intact distal pulses.   No murmur heard. Pulmonary/Chest: Effort normal and breath sounds normal. No respiratory distress. He has no wheezes. He has no rales.  Lymphadenopathy:         Head (right side): No submandibular and no preauricular adenopathy present.       Head (left side): No submandibular and no preauricular adenopathy present.       Right cervical: No superficial cervical and no posterior cervical adenopathy present.      Left cervical: No superficial cervical and no posterior cervical adenopathy present.       Right: No supraclavicular adenopathy present.       Left: No supraclavicular adenopathy present.  Skin: Skin is warm and dry.     [facility] Primary radiology reading by Dr. Georgiana Shore: ? Increased interstitial markings.   ALB NEB given with slight improvement but not like he had had previously.     Assessment & Plan:  Bronchitis - zpak and prednisone taper.  If this does not improve symptoms consider changing qvar to advair or symbicort.

## 2011-12-28 ENCOUNTER — Encounter (HOSPITAL_COMMUNITY): Payer: Self-pay | Admitting: Emergency Medicine

## 2011-12-28 ENCOUNTER — Emergency Department (HOSPITAL_COMMUNITY)
Admission: EM | Admit: 2011-12-28 | Discharge: 2011-12-28 | Disposition: A | Discharge status: Home / Self Care | Payer: BC Managed Care – PPO | Attending: Emergency Medicine | Admitting: Emergency Medicine

## 2011-12-28 DIAGNOSIS — Z91018 Allergy to other foods: Secondary | ICD-10-CM | POA: Insufficient documentation

## 2011-12-28 DIAGNOSIS — R112 Nausea with vomiting, unspecified: Secondary | ICD-10-CM | POA: Insufficient documentation

## 2011-12-28 DIAGNOSIS — R0789 Other chest pain: Secondary | ICD-10-CM | POA: Insufficient documentation

## 2011-12-28 DIAGNOSIS — T7840XA Allergy, unspecified, initial encounter: Secondary | ICD-10-CM

## 2011-12-28 DIAGNOSIS — K5229 Other allergic and dietetic gastroenteritis and colitis: Secondary | ICD-10-CM | POA: Insufficient documentation

## 2011-12-28 DIAGNOSIS — Z79899 Other long term (current) drug therapy: Secondary | ICD-10-CM | POA: Insufficient documentation

## 2011-12-28 MED ORDER — DIPHENHYDRAMINE HCL 25 MG PO TABS: 25.0000 mg | ORAL_TABLET | Freq: Four times a day (QID) | ORAL | Status: AC | PRN

## 2011-12-28 MED ORDER — DIPHENHYDRAMINE HCL 50 MG/ML IJ SOLN
12.5000 mg | Freq: Once | INTRAMUSCULAR | Status: AC
  Administered 2011-12-28: 12.5 mg via INTRAVENOUS

## 2011-12-28 MED ORDER — METHYLPREDNISOLONE SODIUM SUCC 125 MG IJ SOLR
125.0000 mg | Freq: Once | INTRAMUSCULAR | Status: AC
  Administered 2011-12-28: 125 mg via INTRAVENOUS
  Filled 2011-12-28: qty 2

## 2011-12-28 MED ORDER — LORAZEPAM 2 MG/ML IJ SOLN
1.0000 mg | Freq: Once | INTRAMUSCULAR | Status: AC
  Administered 2011-12-28: 1 mg via INTRAVENOUS
  Filled 2011-12-28: qty 1

## 2011-12-28 MED ORDER — DIPHENHYDRAMINE HCL 50 MG/ML IJ SOLN
INTRAMUSCULAR | Status: AC
  Administered 2011-12-28: 21:00:00
  Filled 2011-12-28: qty 1

## 2011-12-28 MED ORDER — PREDNISONE 10 MG PO TABS: 20.0000 mg | ORAL_TABLET | Freq: Every day | ORAL | Status: DC

## 2011-12-28 MED ORDER — ONDANSETRON HCL 4 MG/2ML IJ SOLN
INTRAMUSCULAR | Status: AC
  Administered 2011-12-28: 4 mg
  Filled 2011-12-28: qty 2

## 2011-12-28 MED ORDER — DIPHENHYDRAMINE HCL 50 MG/ML IJ SOLN
12.5000 mg | Freq: Once | INTRAMUSCULAR | Status: AC
  Administered 2011-12-28: 12.5 mg via INTRAVENOUS
  Filled 2011-12-28: qty 1

## 2011-12-28 NOTE — Discharge Instructions (Signed)

## 2011-12-28 NOTE — ED Notes (Signed)
WUJ:WJ19<JY> Expected date:12/28/11<BR> Expected time: 8:12 PM<BR> Means of arrival:Ambulance<BR> Comments:<BR> EMS 65 GC - minor allergic reaction, Zantac 50 mg given, per Dr. Fonnie Jarvis permission, tightness in the throat, abd pain, hx of allergy to whey protein, had a whey protein bar earlier today, breath sounds clear bilaterally, no angioedema noted at this time. Benadryl given as well.

## 2011-12-28 NOTE — ED Notes (Addendum)
Allergic to Penicillin and whey. Had whey protein bar at 1930. Abdominal pain. Tightness in throat and chest. Vomiting and dry heaving. Emesis was brown. No audible wheezing or stridor. BP 140/100 at 2005. Zofran 4mg  dissolvable prior to arrival. 50 mg of benadryl given en route and 50 mg of zantac. 20 gauge in left hand. 100 ml of fluid. Actively vomiting, no diarrhea. No hives or rash developing. Complaints of sore throat with burning sensation. History of asthma and takes ventolin at home.

## 2011-12-28 NOTE — ED Provider Notes (Signed)
History     CSN: [id number]  Arrival date & time 12/28/11  2012   First MD Initiated Contact with Patient 12/28/11 2036      Chief Complaint  Patient presents with  . Allergic Reaction    (Consider location/radiation/quality/duration/timing/severity/associated sxs/prior treatment) Patient is a 26 y.o. male presenting with allergic reaction. The history is provided by the patient.  Allergic Reaction The primary symptoms are  abdominal pain, nausea and vomiting. The primary symptoms do not include shortness of breath, diarrhea or rash.   patient states he is having an allergic reaction to whey protein. He states he has an allergy and hit a bar was a minute. He states he then developed nausea vomiting along with pain in his chest and abdomen. He also states his of his throat is a little tight in his having trouble breathing. He does not feel as if his tongue swelling. Has a history of allergies and a history of asthma. He states he also feels little lightheaded. He has only vomited up stomach contents. Past Medical History  Diagnosis Date  . Allergy   . Asthma     History reviewed. No pertinent past surgical history.  No family history on file.  History  Substance Use Topics  . Smoking status: Never Smoker   . Smokeless tobacco: Not on file  . Alcohol Use: Yes      Review of Systems  Constitutional: Negative for activity change and appetite change.  HENT: Negative for neck stiffness.   Eyes: Negative for pain.  Respiratory: Positive for chest tightness. Negative for shortness of breath.   Cardiovascular: Positive for chest pain. Negative for leg swelling.  Gastrointestinal: Positive for nausea, vomiting and abdominal pain. Negative for diarrhea.  Genitourinary: Negative for flank pain.  Musculoskeletal: Negative for back pain.  Skin: Negative for rash.  Neurological: Negative for weakness, numbness and headaches.  Psychiatric/Behavioral: Negative for behavioral problems.     Allergies  Penicillins; Lactose intolerance (gi); and Whey  Home Medications   Current Outpatient Rx  Name Route Sig Dispense Refill  . ALBUTEROL SULFATE HFA 108 (90 BASE) MCG/ACT IN AERS Inhalation Inhale 2 puffs into the lungs every 6 (six) hours as needed. For shortness of breath. 8.5 Inhaler 1  . AZITHROMYCIN 250 MG PO TABS  2 po on day 1 then 1 po daily for 4 days. 6 tablet 0  . BECLOMETHASONE DIPROPIONATE 40 MCG/ACT IN AERS Inhalation Inhale 2 puffs into the lungs 2 (two) times daily. 1 Inhaler 1  . NAPROXEN SODIUM 220 MG PO TABS Oral Take 220 mg by mouth daily. PRN    . PREDNISONE 20 MG PO TABS  3 po for 2 days, 2 po for 2 days, 1 po for 2 days 12 tablet 0  . SERTRALINE HCL 50 MG PO TABS  Take one tablet every morning with food for one week, increase to 2 every morning after first week 60 tablet 0  . ZOLPIDEM TARTRATE 10 MG PO TABS  Take one half to one tablet at night as needed for sleep 30 tablet 0  . DIPHENHYDRAMINE HCL 25 MG PO TABS Oral Take 1 tablet (25 mg total) by mouth every 6 (six) hours as needed for allergies. 20 tablet 0  . PREDNISONE 10 MG PO TABS Oral Take 2 tablets (20 mg total) by mouth daily. 2 tablet 0    BP 143/85  Pulse 94  Temp(Src) 98 F (36.7 C) (Oral)  Resp 17  SpO2 100%  Physical Exam  Nursing note and vitals reviewed. Constitutional: He is oriented to person, place, and time. He appears well-developed and well-nourished.  HENT:  Head: Normocephalic and atraumatic.       No posterior pharyngeal edema  Eyes: EOM are normal. Pupils are equal, round, and reactive to light.  Neck: Normal range of motion. Neck supple.  Cardiovascular: Normal rate, regular rhythm and normal heart sounds.   No murmur heard. Pulmonary/Chest: Effort normal and breath sounds normal.  Abdominal: Soft. Bowel sounds are normal. He exhibits no distension and no mass. There is no tenderness. There is no rebound and no guarding.  Musculoskeletal: Normal range of motion. He  exhibits no edema.  Neurological: He is alert and oriented to person, place, and time. No cranial nerve deficit.  Skin: Skin is warm and dry.  Psychiatric: He has a normal mood and affect.    ED Course  Procedures (including critical care time)  Labs Reviewed - No data to display No results found.   1. Allergic reaction       MDM  Patient appear allergic reaction to whey protein. No respiratory findings. More GI symptoms or problems. Patient feels better after treatment will be discharged home.        Juliet Rude. Rubin Payor, MD 12/28/11 2314

## 2011-12-28 NOTE — ED Notes (Signed)
Pt resting at bedside. States he's been shaking since the ride to the hospital. Shaking has stopped since. Sleeping.

## 2011-12-31 ENCOUNTER — Encounter (HOSPITAL_COMMUNITY): Payer: Self-pay | Admitting: *Deleted

## 2011-12-31 ENCOUNTER — Emergency Department (HOSPITAL_COMMUNITY)
Admission: EM | Admit: 2011-12-31 | Discharge: 2011-12-31 | Disposition: A | Discharge status: Home / Self Care | Payer: BC Managed Care – PPO | Attending: Emergency Medicine | Admitting: Emergency Medicine

## 2011-12-31 DIAGNOSIS — T887XXA Unspecified adverse effect of drug or medicament, initial encounter: Secondary | ICD-10-CM

## 2011-12-31 DIAGNOSIS — T450X5A Adverse effect of antiallergic and antiemetic drugs, initial encounter: Secondary | ICD-10-CM | POA: Insufficient documentation

## 2011-12-31 DIAGNOSIS — Z880 Allergy status to penicillin: Secondary | ICD-10-CM | POA: Insufficient documentation

## 2011-12-31 DIAGNOSIS — F19982 Other psychoactive substance use, unspecified with psychoactive substance-induced sleep disorder: Secondary | ICD-10-CM | POA: Insufficient documentation

## 2011-12-31 DIAGNOSIS — J45909 Unspecified asthma, uncomplicated: Secondary | ICD-10-CM | POA: Insufficient documentation

## 2011-12-31 HISTORY — DX: Depression, unspecified: F32.A

## 2011-12-31 HISTORY — DX: Major depressive disorder, single episode, unspecified: F32.9

## 2011-12-31 HISTORY — DX: Anxiety disorder, unspecified: F41.9

## 2011-12-31 MED ORDER — LORAZEPAM 1 MG PO TABS: 1.0000 mg | ORAL_TABLET | Freq: Three times a day (TID) | ORAL | Status: DC | PRN

## 2011-12-31 NOTE — ED Notes (Signed)
Pt states "was seen here on 5/24 for an allergic reaction but I haven't slept since, I took some Ambien and some Zoloft but it hasn't helped at all"

## 2011-12-31 NOTE — Discharge Instructions (Signed)
Dystonic Reaction  A dystonic reaction is generally a side effect to a particular medication. Often the medications are used to treat psychological or psychiatric conditions. They often come from other common medications such as antihistamines, Cimetadine, Doxepin and Bromocriptine. The reasons these reactions occur is the normal patterns of our nerve receptors are upset by a particular medication and the imbalance causes multiple types of muscle spasm. This not a drug allergy. It is your own particular response to the particular medication you have taken.  DIAGNOSIS   This diagnosis (learning what is wrong) is made by the obvious symptoms (problems) of contraction of multiple muscles in the body and the usual rapid response to treatment. Because of multiple muscle groups contracting, it is associated with abnormal movements of the face, tongue, neck, abdomen (belly), back and with bizarre grimacing. This illness is rarely life threatening and generally responds within minutes to Benadryl, Cogentin, or Valium. Although sometimes frightening, it is usually over in minutes. If the reaction is not a reaction to medications, additional work up may have to be done to rule out other causes.  HOME CARE INSTRUCTIONS    Generally, after the reaction is over, there will be no return of the disorder.   Avoid use of medications in the future which were thought to be the cause of this.   Do not drive or perform tasks after treatment until medications used to treat have worn off, or until OK'D by your caregiver.   See your caregiver if there is a return of the symptoms which brought you to your caregiver or emergency department.  MAKE SURE YOU:    Understand these instructions.   Will watch your condition.   Will get help right away if you are not doing well or get worse.  Document Released: 07/20/2000 Document Revised: 07/12/2011 Document Reviewed: 03/10/2008  [employer] Patient Information 2012 [employer].

## 2011-12-31 NOTE — ED Provider Notes (Signed)
History     CSN: [id number]  Arrival date & time 12/31/11  0736   First MD Initiated Contact with Patient 12/31/11 [phone]      No chief complaint on file.   (Consider location/radiation/quality/duration/timing/severity/associated sxs/prior treatment) HPI Patient states he was seen here three days ago for an allergic reaction to whey.  He has been taking benadryl since and is feeling shaky and not sleeping.  He is taking ambien and zoloft without relief.  These are prescribed by urgent care.  He states the allergic symptoms have resolved.    Past Medical History  Diagnosis Date  . Allergy   . Asthma     No past surgical history on file.  No family history on file.  History  Substance Use Topics  . Smoking status: Never Smoker   . Smokeless tobacco: Not on file  . Alcohol Use: Yes      Review of Systems  All other systems reviewed and are negative.    Allergies  Penicillins; Lactose intolerance (gi); and Whey  Home Medications   Current Outpatient Rx  Name Route Sig Dispense Refill  . ALBUTEROL SULFATE HFA 108 (90 BASE) MCG/ACT IN AERS Inhalation Inhale 2 puffs into the lungs every 6 (six) hours as needed. For shortness of breath. 8.5 Inhaler 1  . BECLOMETHASONE DIPROPIONATE 40 MCG/ACT IN AERS Inhalation Inhale 2 puffs into the lungs 2 (two) times daily. 1 Inhaler 1  . DIPHENHYDRAMINE HCL 25 MG PO TABS Oral Take 1 tablet (25 mg total) by mouth every 6 (six) hours as needed for allergies. 20 tablet 0  . NAPROXEN SODIUM 220 MG PO TABS Oral Take 220 mg by mouth daily. PRN    . PREDNISONE 10 MG PO TABS Oral Take 2 tablets (20 mg total) by mouth daily. 2 tablet 0  . PREDNISONE 20 MG PO TABS  3 po for 2 days, 2 po for 2 days, 1 po for 2 days 12 tablet 0  . SERTRALINE HCL 50 MG PO TABS  Take one tablet every morning with food for one week, increase to 2 every morning after first week 60 tablet 0  . ZOLPIDEM TARTRATE 10 MG PO TABS  Take one half to one tablet at night as  needed for sleep 30 tablet 0    BP 134/95  Pulse 92  Temp(Src) 98 F (36.7 C) (Oral)  Resp 20  SpO2 98%  Physical Exam  Nursing note and vitals reviewed. Constitutional: He is oriented to person, place, and time. He appears well-developed and well-nourished.  HENT:  Head: Normocephalic and atraumatic.  Eyes: Conjunctivae and EOM are normal. Pupils are equal, round, and reactive to light.  Neck: Normal range of motion. Neck supple.  Cardiovascular: Normal rate and regular rhythm.   Pulmonary/Chest: Effort normal and breath sounds normal.  Abdominal: Soft.  Musculoskeletal: Normal range of motion.  Neurological: He is alert and oriented to person, place, and time. He has normal reflexes.  Skin: Skin is warm and dry.  Psychiatric: He has a normal mood and affect.    ED Course  Procedures (including critical care time)  Labs Reviewed - No data to display No results found.   No diagnosis found.    MDM  Patient with adverse reaction to benadryl.  Advised may stop benadryl now and will give ativan 1 mg x2 for use to reset sleep schedule.          Hilario Quarry, MD 12/31/11 [phone]

## 2012-01-01 ENCOUNTER — Ambulatory Visit (INDEPENDENT_AMBULATORY_CARE_PROVIDER_SITE_OTHER): Payer: BC Managed Care – PPO | Admitting: Family Medicine

## 2012-01-01 DIAGNOSIS — F411 Generalized anxiety disorder: Secondary | ICD-10-CM

## 2012-01-01 DIAGNOSIS — F419 Anxiety disorder, unspecified: Secondary | ICD-10-CM

## 2012-01-01 DIAGNOSIS — R109 Unspecified abdominal pain: Secondary | ICD-10-CM

## 2012-01-01 MED ORDER — LORAZEPAM 1 MG PO TABS: 1.0000 mg | ORAL_TABLET | Freq: Two times a day (BID) | ORAL | Status: AC | PRN

## 2012-01-01 MED ORDER — PEG 3350-KCL-NABCB-NACL-NASULF 236 G PO SOLR: ORAL | Status: DC

## 2012-01-01 NOTE — Patient Instructions (Signed)
Dr. Dub Mikes can help with your anxiety; please call and schedule appointment Keep follow up appointment with Dr. Elnoria Howard

## 2012-01-01 NOTE — Progress Notes (Signed)
  Subjective:    Patient ID: Brad Davidson, male    DOB: [birth year], 26 y.o.   MRN: [id number]  HPI  Patient presents complaining of constipation despite Miralax 2-3 times a day. Patient states secondary to the constipation he has developed early satiety. Has seen Dr. Elnoria Davidson in the past who prescribed the Miralax. Contacted Dr. Haywood Davidson office and has an appointment for next week. Here for evaluation of symptoms until he can see Brad Davidson.  Insists on BW for Celiac disease  Genella Rife-  Dexilant prescribed which has helped symptoms significantly.  Anxiety- walking regularly to treat symptoms; ER visit where Ativan was prescribed; requests refill.  Review of Systems     Objective:   Physical Exam  Constitutional: He appears well-developed.  HENT:  Mouth/Throat: Oropharynx is clear and moist.  Neck: Neck supple. No thyromegaly present.  Cardiovascular: Normal rate, regular rhythm and normal heart sounds.   Pulmonary/Chest: Effort normal and breath sounds normal.  Abdominal: Soft. Bowel sounds are normal. He exhibits no distension. There is no tenderness.  Neurological: He is alert.          Assessment & Plan:   1. Abdominal pain  TSH, Celiac panel, polyethylene glycol (GOLYTELY) 236 G solution  2. Anxiety  LORazepam (ATIVAN) 1 MG tablet   Encouraged patient to keep follow up with Dr. Elnoria Davidson. Unable to refill Ativan in the future. Strongly encouraged psychiatric or psychologic follow up to treat anxious symptoms

## 2012-01-02 LAB — GLIA (IGA/G) + TTG IGA
Gliadin IgG: 4.3 U/mL (ref <20)
Tissue Transglutaminase Ab, IgA: 6.7 U/mL (ref <20)

## 2012-01-03 ENCOUNTER — Encounter: Payer: Self-pay | Admitting: Family Medicine

## 2012-01-03 ENCOUNTER — Encounter: Payer: Self-pay | Admitting: *Deleted

## 2012-01-08 ENCOUNTER — Ambulatory Visit (INDEPENDENT_AMBULATORY_CARE_PROVIDER_SITE_OTHER): Payer: BC Managed Care – PPO | Admitting: Family Medicine

## 2012-01-08 VITALS — BP 114/80 | HR 74 | Temp 97.7°F | Resp 16 | Ht 70.75 in | Wt 175.8 lb

## 2012-01-08 DIAGNOSIS — R109 Unspecified abdominal pain: Secondary | ICD-10-CM

## 2012-01-08 LAB — POCT CBC
HCT, POC: 47.1 % (ref 43.5–53.7)
Hemoglobin: 15.3 g/dL (ref 14.1–18.1)
Lymph, poc: 2 (ref 0.6–3.4)
MCHC: 32.5 g/dL (ref 31.8–35.4)
POC Granulocyte: 7.2 — AB (ref 2–6.9)

## 2012-01-08 MED ORDER — PEG 3350-KCL-NABCB-NACL-NASULF 236 G PO SOLR: ORAL | Status: DC

## 2012-01-08 NOTE — Progress Notes (Signed)
Patient Name: Brad Davidson Date of Birth: Feb 22, 1986 Medical Record Number: [id number] Gender: male Date of Encounter: 01/08/2012  History of Present Illness:  Brad Davidson is a 26 y.o. very pleasant male patient who presents with the following:  He has been seen at our clinic or at ED 8 times since March of this year.  Yesterday he ate a burrito, and then he noted that "nothing would stay down."  He has burping, and then "it started coming up a little bit, and then I started having heart palpitations which I could see in my stomach."  He has been on Miralax recently, previously golytely, and this seemed to help- he would feel better whenever he had a BM.  He states that he is having to manually disimpact himself sometimes.   Today he ate some oatmeal and blueberries, then he went for a walk.  He notes that "I can't sit still because I start to feel sick."  He last saw Dr. Elnoria Howard a few weeks ago and was taking some carafate.  He is supposed to have an endoscopy but has had trouble financially  He has been taking ativan at night sometimes.    He does not feel that he is particularly anxious.  He is currently looking for a job, unemployed.    Patient Active Problem List  Diagnoses  . Allergy history unknown  . Asthma   Past Medical History  Diagnosis Date  . Allergy   . Asthma   . Anxiety   . Depression    No past surgical history on file. History  Substance Use Topics  . Smoking status: Never Smoker   . Smokeless tobacco: Not on file  . Alcohol Use: No     quit x 1 yr ago   No family history on file. Allergies  Allergen Reactions  . Penicillins Hives  . Lactose Intolerance (Gi) Nausea Only  . Whey Nausea Only    Medication list has been reviewed and updated.  Prior to Admission medications   Medication Sig Start Date End Date Taking? Authorizing Provider  albuterol (PROVENTIL HFA;VENTOLIN HFA) 108 (90 BASE) MCG/ACT inhaler Inhale 2 puffs into the lungs every 6 (six) hours  as needed. For shortness of breath. 12/06/11  Yes Rickard Patience, PA-C  IRON PO Take 1 tablet by mouth daily.   Yes Historical Provider, MD  LORazepam (ATIVAN) 1 MG tablet Take 1 tablet (1 mg total) by mouth 2 (two) times daily as needed for anxiety. 01/01/12 01/11/12 Yes Dois Davenport, MD  Multiple Vitamin (MULITIVITAMIN WITH MINERALS) TABS Take 1 tablet by mouth daily.   Yes Historical Provider, MD  naproxen sodium (ANAPROX) 220 MG tablet Take 220 mg by mouth 2 (two) times daily as needed. For pain.   Yes Historical Provider, MD  polyethylene glycol (GOLYTELY) 236 G solution 1 cup three times a day for 2 days 01/01/12  Yes Dois Davenport, MD  sertraline (ZOLOFT) 50 MG tablet Take 50 mg by mouth daily.   Yes Historical Provider, MD  zolpidem (AMBIEN) 10 MG tablet Take 10 mg by mouth at bedtime as needed. For sleep.   Yes Historical Provider, MD  diphenhydrAMINE (BENADRYL) 25 MG tablet Take 1 tablet (25 mg total) by mouth every 6 (six) hours as needed for allergies. 12/28/11 01/27/12  Juliet Rude. Pickering, MD    Review of Systems:  As per HPI- otherwise negative.   Physical Examination: Filed Vitals:   01/08/12 1349  BP: 114/80  Pulse: 74  Temp: 97.7 F (36.5 C)  Resp: 16   Filed Vitals:   01/08/12 1349  Height: 5' 10.75" (1.797 m)  Weight: 175 lb 12.8 oz (79.742 kg)   Body mass index is 24.69 kg/(m^2).  GEN: WDWN, NAD, Non-toxic, A & O x 3- pacing room, appears anxious  HEENT: Atraumatic, Normocephalic. Neck supple. No masses, No LAD.  Tm and oropharynx wnl, PEERL Ears and Nose: No external deformity. CV: RRR, No M/G/R. No JVD. No thrill. No extra heart sounds. PULM: CTA B, no wheezes, crackles, rhonchi. No retractions. No resp. distress. No accessory muscle use. ABD: S, NT, +BS. No rebound. No HSM. Minimal tenderness in his lower abdomen, but not severe.   EXTR: No c/c/e NEURO Normal gait.  PSYCH: Normally interactive. Conversant.    Results for orders placed in visit on  01/08/12  POCT CBC      Component Value Range   WBC 9.7  4.6 - 10.2 (K/uL)   Lymph, poc 2.0  0.6 - 3.4    POC LYMPH PERCENT 21.0  10 - 50 (%L)   MID (cbc) 0.5  0 - 0.9    POC MID % 5.2  0 - 12 (%M)   POC Granulocyte 7.2 (*) 2 - 6.9    Granulocyte percent 73.8  37 - 80 (%G)   RBC 5.29  4.69 - 6.13 (M/uL)   Hemoglobin 15.3  14.1 - 18.1 (g/dL)   HCT, POC 16.1  09.6 - 53.7 (%)   MCV 89.1  80 - 97 (fL)   MCH, POC 28.9  27 - 31.2 (pg)   MCHC 32.5  31.8 - 35.4 (g/dL)   RDW, POC 04.5     Platelet Count, POC 293  142 - 424 (K/uL)   MPV 10.6  0 - 99.8 (fL)    Assessment and Plan: 1. Abdominal  pain, other specified site  POCT CBC, polyethylene glycol (GOLYTELY) 236 G solution  26 year old male with chronic ?esophageal stricture/ gastroparesis/ constipation symptoms vs anxiety.  Encouraged him to please try and get the endoscopy done, as this information would be very helpful.  Discussed with Dr. Elnoria Howard who is glad to see him back in clinic to have this done.  I did give her another rx of golytely, as this has been helpful to him.  Did mention that if his GI work-up is negative he should explore the possibility of anxiety as the cause of his symptoms.    Abbe Amsterdam, MD 01/08/2012 1:55 PM

## 2012-03-24 ENCOUNTER — Emergency Department (HOSPITAL_COMMUNITY)
Admission: EM | Admit: 2012-03-24 | Discharge: 2012-03-24 | Disposition: A | Discharge status: Home / Self Care | Payer: BC Managed Care – PPO | Attending: Emergency Medicine | Admitting: Emergency Medicine

## 2012-03-24 ENCOUNTER — Encounter (HOSPITAL_COMMUNITY): Payer: Self-pay | Admitting: *Deleted

## 2012-03-24 ENCOUNTER — Emergency Department (HOSPITAL_COMMUNITY): Payer: BC Managed Care – PPO

## 2012-03-24 DIAGNOSIS — B9789 Other viral agents as the cause of diseases classified elsewhere: Secondary | ICD-10-CM | POA: Insufficient documentation

## 2012-03-24 DIAGNOSIS — R55 Syncope and collapse: Secondary | ICD-10-CM | POA: Insufficient documentation

## 2012-03-24 DIAGNOSIS — E860 Dehydration: Secondary | ICD-10-CM

## 2012-03-24 DIAGNOSIS — F3289 Other specified depressive episodes: Secondary | ICD-10-CM | POA: Insufficient documentation

## 2012-03-24 DIAGNOSIS — F329 Major depressive disorder, single episode, unspecified: Secondary | ICD-10-CM | POA: Insufficient documentation

## 2012-03-24 DIAGNOSIS — R109 Unspecified abdominal pain: Secondary | ICD-10-CM | POA: Insufficient documentation

## 2012-03-24 DIAGNOSIS — F411 Generalized anxiety disorder: Secondary | ICD-10-CM | POA: Insufficient documentation

## 2012-03-24 DIAGNOSIS — J45909 Unspecified asthma, uncomplicated: Secondary | ICD-10-CM | POA: Insufficient documentation

## 2012-03-24 DIAGNOSIS — B349 Viral infection, unspecified: Secondary | ICD-10-CM

## 2012-03-24 DIAGNOSIS — R1110 Vomiting, unspecified: Secondary | ICD-10-CM | POA: Insufficient documentation

## 2012-03-24 DIAGNOSIS — Z79899 Other long term (current) drug therapy: Secondary | ICD-10-CM | POA: Insufficient documentation

## 2012-03-24 LAB — COMPREHENSIVE METABOLIC PANEL
CO2: 24 mEq/L (ref 19–32)
Calcium: 8.6 mg/dL (ref 8.4–10.5)
Creatinine, Ser: 0.83 mg/dL (ref 0.50–1.35)
GFR calc Af Amer: >90 mL/min (ref >90)
GFR calc non Af Amer: >90 mL/min (ref >90)
Glucose, Bld: 95 mg/dL (ref 70–99)
Total Bilirubin: 0.3 mg/dL (ref 0.3–1.2)

## 2012-03-24 LAB — URINALYSIS, ROUTINE W REFLEX MICROSCOPIC
Bilirubin Urine: NEGATIVE
Glucose, UA: NEGATIVE mg/dL
Ketones, ur: NEGATIVE mg/dL
Leukocytes, UA: NEGATIVE
Nitrite: NEGATIVE
Protein, ur: NEGATIVE mg/dL

## 2012-03-24 LAB — CBC WITH DIFFERENTIAL/PLATELET
Eosinophils Relative: 1 % (ref 0–5)
HCT: 43.1 % (ref 39.0–52.0)
Hemoglobin: 14.9 g/dL (ref 13.0–17.0)
Lymphocytes Relative: 13 % (ref 12–46)
Lymphs Abs: 1.6 10*3/uL (ref 0.7–4.0)
MCV: 85.3 fL (ref 78.0–100.0)
Monocytes Absolute: 0.5 10*3/uL (ref 0.1–1.0)
Monocytes Relative: 4 % (ref 3–12)
RBC: 5.05 MIL/uL (ref 4.22–5.81)
WBC: 12.3 10*3/uL — ABNORMAL HIGH (ref 4.0–10.5)

## 2012-03-24 LAB — TROPONIN I: Troponin I: <0.3 ng/mL (ref <0.30)

## 2012-03-24 MED ORDER — ONDANSETRON 4 MG PO TBDP: ORAL_TABLET | ORAL | Status: AC

## 2012-03-24 MED ORDER — KETOROLAC TROMETHAMINE 30 MG/ML IJ SOLN
30.0000 mg | Freq: Once | INTRAMUSCULAR | Status: AC
  Administered 2012-03-24: 30 mg via INTRAVENOUS
  Filled 2012-03-24: qty 1

## 2012-03-24 MED ORDER — ONDANSETRON HCL 4 MG/2ML IJ SOLN
INTRAMUSCULAR | Status: AC
  Filled 2012-03-24: qty 2

## 2012-03-24 MED ORDER — ONDANSETRON HCL 4 MG/2ML IJ SOLN
4.0000 mg | Freq: Once | INTRAMUSCULAR | Status: AC
  Administered 2012-03-24: 4 mg via INTRAVENOUS
  Filled 2012-03-24: qty 2

## 2012-03-24 NOTE — ED Notes (Signed)
ems gave pt 4mg  zofran in route

## 2012-03-24 NOTE — ED Provider Notes (Signed)
History     CSN: [id number]  Arrival date & time 03/24/12  1318   First MD Initiated Contact with Patient 03/24/12 1501      Chief Complaint  Patient presents with  . Emesis    (Consider location/radiation/quality/duration/timing/severity/associated sxs/prior treatment) HPI Comments: Brad Davidson is a 26 y.o. Male history of asthma here with syncope and abdominal pain and vomiting and diarrhea. For the last 3 days, he has some diarrhea. Denies fevers or vomiting until today. He was at jury duty when he felt nauseous. He ate little for breakfast and around 2 hours ago, he felt lightheaded and had an episode of syncope and laid on the floor. No head injury. He tried to eat something but was then vomiting and had lower abdominal pain. No dysuria no recent travel and no sick contacts. He also felt a little SOB after passing out but no chest pain or palpitations or wheezing. No family hx of CAD and he is not a smoker   The history is provided by the patient.    Past Medical History  Diagnosis Date  . Allergy   . Asthma   . Anxiety   . Depression     History reviewed. No pertinent past surgical history.  No family history on file.  History  Substance Use Topics  . Smoking status: Never Smoker   . Smokeless tobacco: Not on file  . Alcohol Use: No     quit x 1 yr ago      Review of Systems  Respiratory: Positive for shortness of breath.   Gastrointestinal: Positive for nausea, vomiting, abdominal pain and diarrhea.  Neurological: Positive for syncope and light-headedness.  All other systems reviewed and are negative.    Allergies  Penicillins; Lactose intolerance (gi); and Whey  Home Medications   Current Outpatient Rx  Name Route Sig Dispense Refill  . ALBUTEROL SULFATE HFA 108 (90 BASE) MCG/ACT IN AERS Inhalation Inhale 2 puffs into the lungs every 6 (six) hours as needed. For shortness of breath. 8.5 Inhaler 1  . ADULT MULTIVITAMIN W/MINERALS CH Oral Take 1  tablet by mouth daily.    Marland Kitchen NAPROXEN SODIUM 220 MG PO TABS Oral Take 220 mg by mouth 2 (two) times daily as needed. For pain.      BP 126/67  Pulse 86  Temp 98.1 F (36.7 C) (Oral)  Resp 16  SpO2 100%  Physical Exam  Nursing note and vitals reviewed. Constitutional: He is oriented to person, place, and time. He appears well-developed and well-nourished.       Uncomfortable   HENT:  Head: Normocephalic and atraumatic.       OP slightly dry  Eyes: Conjunctivae and EOM are normal. Pupils are equal, round, and reactive to light.  Neck: Normal range of motion. Neck supple.  Cardiovascular: Normal rate, regular rhythm, normal heart sounds and intact distal pulses.   Pulmonary/Chest: Effort normal and breath sounds normal.  Abdominal: Soft.       + BS, mild diffuse lower abdominal tenderness without rebound.   Musculoskeletal: Normal range of motion.  Neurological: He is alert and oriented to person, place, and time.  Skin: Skin is warm.  Psychiatric: He has a normal mood and affect. His behavior is normal. Judgment and thought content normal.    ED Course  Procedures (including critical care time)  Labs Reviewed  CBC WITH DIFFERENTIAL - Abnormal; Notable for the following:    WBC 12.3 (*)     Neutrophils Relative  82 (*)     Neutro Abs 10.1 (*)     All other components within normal limits  COMPREHENSIVE METABOLIC PANEL  LIPASE, BLOOD  TROPONIN I  URINALYSIS, ROUTINE W REFLEX MICROSCOPIC   Dg Chest 2 View  03/24/2012  *RADIOLOGY REPORT*  Clinical Data: Abdominal pain, nausea, vomiting, weakness  CHEST - 2 VIEW  Comparison: 12/23/2011  Findings: Normal heart size, mediastinal contours, and pulmonary vascularity. Peribronchial thickening, chronic. Lungs otherwise clear. No pleural effusion or pneumothorax. No acute osseous findings.  IMPRESSION: Minimal chronic bronchitic changes. No acute abnormalities.   Original Report Authenticated By: Lollie Marrow, M.D.      1.  Dehydration   2. Viral syndrome   3. Syncope      Date: 03/24/2012  Rate: 83  Rhythm: normal sinus rhythm  QRS Axis: normal  Intervals: normal  ST/T Wave abnormalities: normal  Conduction Disutrbances:none  Narrative Interpretation:   Old EKG Reviewed: unchanged     MDM  Brad Davidson is a 26 y.o. male here with syncope and vomiting. He appears dehydrated and that is likely to be the etiology of his syncope. He is low risk for ACS. Will check EKG, CBC, CMP, UA, and orthostatics. Will give IVF and zofran and reassess.   7:08 PM Patient not orthostatic. CBC, CMP unremarkable. CXR nl, trop neg x 1, EKG nl. Patient tolerated PO and felt well. Abdomen nontender. Counsel patient to drink plenty of fluids.        Richardean Canal, MD 03/24/12 [phone]

## 2012-03-24 NOTE — ED Notes (Signed)
Family at bedside. 

## 2012-03-24 NOTE — ED Notes (Signed)
MD at bedside. Brad Davidson

## 2012-03-24 NOTE — ED Notes (Signed)
Patient just returned from xray

## 2012-03-24 NOTE — ED Notes (Signed)
MWN:[id number]> Expected date:03/24/12<BR> Expected time: 1:05 PM<BR> Means of arrival:Ambulance<BR> Comments:<BR> 26yoM, n/v/d

## 2012-03-25 ENCOUNTER — Encounter: Payer: Self-pay | Admitting: Family Medicine

## 2012-03-25 ENCOUNTER — Other Ambulatory Visit: Payer: Self-pay | Admitting: Internal Medicine

## 2012-03-25 NOTE — Telephone Encounter (Signed)
SHOULD STILL HAVE 1 REFILL, NEEDS OV FOR MORE

## 2012-06-23 ENCOUNTER — Other Ambulatory Visit: Payer: Self-pay | Admitting: Physician Assistant

## 2012-08-15 ENCOUNTER — Other Ambulatory Visit: Payer: Self-pay | Admitting: Physician Assistant

## 2012-10-08 ENCOUNTER — Other Ambulatory Visit: Payer: Self-pay | Admitting: Physician Assistant

## 2012-10-13 ENCOUNTER — Ambulatory Visit (INDEPENDENT_AMBULATORY_CARE_PROVIDER_SITE_OTHER): Payer: BC Managed Care – PPO | Admitting: Family Medicine

## 2012-10-13 VITALS — BP 128/88 | HR 86 | Temp 98.1°F | Resp 16 | Ht 72.0 in | Wt 189.0 lb

## 2012-10-13 DIAGNOSIS — J449 Chronic obstructive pulmonary disease, unspecified: Secondary | ICD-10-CM

## 2012-10-13 DIAGNOSIS — J45909 Unspecified asthma, uncomplicated: Secondary | ICD-10-CM

## 2012-10-13 DIAGNOSIS — R079 Chest pain, unspecified: Secondary | ICD-10-CM

## 2012-10-13 DIAGNOSIS — K219 Gastro-esophageal reflux disease without esophagitis: Secondary | ICD-10-CM

## 2012-10-13 LAB — POCT CBC
Granulocyte percent: 60.2 %G (ref 37–80)
MCV: 89.3 fL (ref 80–97)
MID (cbc): 0.6 (ref 0–0.9)
POC Granulocyte: 4.7 (ref 2–6.9)
Platelet Count, POC: 299 10*3/uL (ref 142–424)
RBC: 5.59 M/uL (ref 4.69–6.13)
RDW, POC: 13.7 %

## 2012-10-13 MED ORDER — ALBUTEROL SULFATE HFA 108 (90 BASE) MCG/ACT IN AERS: INHALATION_SPRAY | RESPIRATORY_TRACT | Status: DC

## 2012-10-13 MED ORDER — ESOMEPRAZOLE MAGNESIUM 40 MG PO CPDR: 40.0000 mg | DELAYED_RELEASE_CAPSULE | Freq: Two times a day (BID) | ORAL | Status: DC

## 2012-10-13 MED ORDER — BECLOMETHASONE DIPROPIONATE 80 MCG/ACT IN AERS: 2.0000 | INHALATION_SPRAY | Freq: Two times a day (BID) | RESPIRATORY_TRACT | Status: DC

## 2012-10-13 MED ORDER — MELOXICAM 15 MG PO TABS: 15.0000 mg | ORAL_TABLET | Freq: Every day | ORAL | Status: DC | PRN
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
## 2012-10-13 NOTE — Patient Instructions (Signed)
Esophageal Spasm Esophageal spasm is an uncoordinated contraction of the muscles of the esophagus (the tube which carries food from your mouth to your stomach). Normally, the muscles of the esophagus alternate between contraction and relaxation starting from the top of the esophagus and working down to the bottom. This moves the food from the mouth to the stomach. In esophageal spasm, all the muscles contract at once. This causes pain and fails to move the food along. As a result, you may have trouble swallowing.  Women are more likely than men to have esophageal spasm. The cause of the spasms is not known. Sometimes eating hot or cold foods triggers the condition and this may be due to an overly sensitive esophagus. This is not an infectious disease and cannot be passed to others. SYMPTOMS  Symptoms of esophageal spasm may include: chest pain, burning or pain with swallowing, and difficulty swallowing.  DIAGNOSIS  Esophageal spasm can be diagnosed by a test called manometry (pressure studies of the esophagus). In this test, a special tube is inserted down the esophagus. The tube measures the muscle activity of the esophagus. Abnormal contractions mixed with normal movement helps confirm the diagnosis.  A person with a hypersensitive esophagus may be diagnosed by inflating a long balloon in the person's esophagus. If this causes the same symptoms, preventive methods may work. PREVENTION  Avoid hot or cold foods if that seems to be a trigger. PROGNOSIS  This condition does not go away, nor is treatment entirely satisfactory. Patients need to be careful of what they eat. They need to continue on medication if a useful one is found. Fortunately, the condition does not get progressively worse as time passes. Esophageal spasm does not usually lead to more serious problems but sometimes the pain can be disabling. If a person becomes afraid to eat they may become malnourished and lose weight.  TREATMENT   A  procedure in which instruments of increasing size are inserted through the esophagus to enlarge (dilate) it are used.  Medications that decrease acid-production of the stomach may be used such as proton-pump inhibitors or H2-blockers.  Medications of several types can be used to relax the muscles of the esophagus.  An individual with a hypersensitive esophagus sometimes improves with low doses of medications normally used for depression.  No treatment for esophageal spasm is effective for everyone. Often several approaches will be tried before one works. In many cases, the symptoms will improve, but will not go away completely.  For severe cases, relief is obtained two-thirds of the time by cutting the muscles along the entire length of the esophagus. This is a major surgical procedure.  Your symptoms are usually the best guide to how well the treatment for esophageal spasm works. SIDE EFFECTS OF TREATMENTS  Nitrates can cause headaches and low blood pressure.  Calcium channel blockers can cause:  Feeling sick to your stomach (nausea).  Constipation and other side effects.  Antidepressants can cause side effects that depend on the medication used. HOME CARE INSTRUCTIONS   Let your caregiver know if problems are getting worse, or if you get food stuck in your esophagus for longer than 1 hour or as directed and are unable to swallow liquid.  Take medications as directed and with permission of your caregiver. Ask about what to do if a medication seems to get stuck in your esophagus. Only take over-the-counter or prescription medicines for pain, discomfort, or fever as directed by your caregiver.  Soft and liquid foods  pass more easily than solid pieces. SEEK IMMEDIATE MEDICAL CARE IF:   You develop severe chest pain, especially if the pain is crushing or pressure-like and spreads to the arms, back, neck, or jaw, or if you have sweating, nausea, or shortness of breath. THIS COULD BE AN  EMERGENCY. Do not wait to see if the pain will go away. Get medical help at once. Call 911 or 0 (operator). DO NOT drive yourself to the hospital.  Your chest pain gets worse and does not go away with rest.  You have an attack of chest pain lasting longer than usual despite rest and treatment with the medications your physician has prescribed.  You wake from sleep with chest pain or shortness of breath.  You feel dizzy or faint.  You have chest pain, not typical of your usual pain, caused by your esophagus for which you originally saw your caregiver. MAKE SURE YOU:   Understand these instructions.  Will watch your condition.  Will get help right away if you are not doing well or get worse. Document Released: 10/13/2002 Document Revised: 10/15/2011 Document Reviewed: 04/27/2008 [facility] Patient Information 2013 [place], [place].

## 2012-10-15 LAB — COMPREHENSIVE METABOLIC PANEL
ALT: 64 U/L — ABNORMAL HIGH (ref 0–53)
AST: 30 U/L (ref 0–37)
Albumin: 4.9 g/dL (ref 3.5–5.2)
Alkaline Phosphatase: 79 U/L (ref 39–117)
Glucose, Bld: 81 mg/dL (ref 70–99)
Potassium: 4.2 mEq/L (ref 3.5–5.3)
Sodium: 139 mEq/L (ref 135–145)
Total Bilirubin: 0.4 mg/dL (ref 0.3–1.2)
Total Protein: 7.6 g/dL (ref 6.0–8.3)

## 2012-10-15 LAB — H. PYLORI ANTIBODY, IGG: H Pylori IgG: 0.4 {ISR}

## 2013-08-06 ENCOUNTER — Emergency Department (HOSPITAL_COMMUNITY)
Admission: EM | Admit: 2013-08-06 | Discharge: 2013-08-06 | Disposition: A | Discharge status: Home / Self Care | Payer: No Typology Code available for payment source | Attending: Emergency Medicine | Admitting: Emergency Medicine

## 2013-08-06 ENCOUNTER — Encounter (HOSPITAL_COMMUNITY): Payer: Self-pay | Admitting: Emergency Medicine

## 2013-08-06 ENCOUNTER — Emergency Department (HOSPITAL_COMMUNITY): Payer: No Typology Code available for payment source

## 2013-08-06 DIAGNOSIS — Z79899 Other long term (current) drug therapy: Secondary | ICD-10-CM | POA: Insufficient documentation

## 2013-08-06 DIAGNOSIS — R079 Chest pain, unspecified: Secondary | ICD-10-CM | POA: Insufficient documentation

## 2013-08-06 DIAGNOSIS — Z880 Allergy status to penicillin: Secondary | ICD-10-CM | POA: Insufficient documentation

## 2013-08-06 DIAGNOSIS — IMO0001 Reserved for inherently not codable concepts without codable children: Secondary | ICD-10-CM | POA: Insufficient documentation

## 2013-08-06 DIAGNOSIS — R197 Diarrhea, unspecified: Secondary | ICD-10-CM | POA: Insufficient documentation

## 2013-08-06 DIAGNOSIS — R109 Unspecified abdominal pain: Secondary | ICD-10-CM | POA: Insufficient documentation

## 2013-08-06 DIAGNOSIS — K2990 Gastroduodenitis, unspecified, without bleeding: Principal | ICD-10-CM

## 2013-08-06 DIAGNOSIS — K2970 Gastritis, unspecified, without bleeding: Secondary | ICD-10-CM

## 2013-08-06 DIAGNOSIS — R51 Headache: Secondary | ICD-10-CM | POA: Insufficient documentation

## 2013-08-06 LAB — URINALYSIS, ROUTINE W REFLEX MICROSCOPIC
BILIRUBIN URINE: NEGATIVE
Glucose, UA: NEGATIVE mg/dL
HGB URINE DIPSTICK: NEGATIVE
Ketones, ur: NEGATIVE mg/dL
Leukocytes, UA: NEGATIVE
Nitrite: NEGATIVE
PROTEIN: NEGATIVE mg/dL
Specific Gravity, Urine: 1.013 (ref 1.005–1.030)
Urobilinogen, UA: 0.2 mg/dL (ref 0.0–1.0)
pH: 6.5 (ref 5.0–8.0)

## 2013-08-06 LAB — COMPREHENSIVE METABOLIC PANEL
ALBUMIN: 4.2 g/dL (ref 3.5–5.2)
ALT: 42 U/L (ref 0–53)
AST: 23 U/L (ref 0–37)
Alkaline Phosphatase: 111 U/L (ref 39–117)
BILIRUBIN TOTAL: 0.3 mg/dL (ref 0.3–1.2)
BUN: 13 mg/dL (ref 6–23)
CHLORIDE: 102 meq/L (ref 96–112)
CO2: 22 meq/L (ref 19–32)
Calcium: 9.2 mg/dL (ref 8.4–10.5)
Creatinine, Ser: 0.85 mg/dL (ref 0.50–1.35)
GFR calc Af Amer: >90 mL/min (ref >90)
Glucose, Bld: 112 mg/dL — ABNORMAL HIGH (ref 70–99)
POTASSIUM: 3.7 meq/L (ref 3.7–5.3)
SODIUM: 139 meq/L (ref 137–147)
Total Protein: 7.7 g/dL (ref 6.0–8.3)

## 2013-08-06 LAB — CBC WITH DIFFERENTIAL/PLATELET
BASOS ABS: 0 10*3/uL (ref 0.0–0.1)
BASOS PCT: 0 % (ref 0–1)
Eosinophils Absolute: 0.5 10*3/uL (ref 0.0–0.7)
Eosinophils Relative: 5 % (ref 0–5)
HEMATOCRIT: 45.7 % (ref 39.0–52.0)
Hemoglobin: 16.2 g/dL (ref 13.0–17.0)
LYMPHS PCT: 21 % (ref 12–46)
Lymphs Abs: 2.2 10*3/uL (ref 0.7–4.0)
MCH: 29.7 pg (ref 26.0–34.0)
MCHC: 35.4 g/dL (ref 30.0–36.0)
MCV: 83.9 fL (ref 78.0–100.0)
Monocytes Absolute: 0.8 10*3/uL (ref 0.1–1.0)
Monocytes Relative: 8 % (ref 3–12)
NEUTROS ABS: 6.9 10*3/uL (ref 1.7–7.7)
NEUTROS PCT: 66 % (ref 43–77)
Platelets: 219 10*3/uL (ref 150–400)
RBC: 5.45 MIL/uL (ref 4.22–5.81)
RDW: 12.8 % (ref 11.5–15.5)
WBC: 10.5 10*3/uL (ref 4.0–10.5)

## 2013-08-06 LAB — POCT I-STAT TROPONIN I: TROPONIN I, POC: 0.01 ng/mL (ref 0.00–0.08)

## 2013-08-06 LAB — LIPASE, BLOOD: Lipase: 17 U/L (ref 11–59)

## 2013-08-06 MED ORDER — ONDANSETRON 4 MG PO TBDP: 4.0000 mg | ORAL_TABLET | Freq: Three times a day (TID) | ORAL | Status: DC | PRN

## 2013-08-06 MED ORDER — ONDANSETRON HCL 4 MG/2ML IJ SOLN
4.0000 mg | Freq: Once | INTRAMUSCULAR | Status: AC
  Administered 2013-08-06: 4 mg via INTRAVENOUS
  Filled 2013-08-06: qty 2

## 2013-08-06 MED ORDER — SODIUM CHLORIDE 0.9 % IV SOLN
INTRAVENOUS | Status: DC
  Administered 2013-08-06: 12:00:00 via INTRAVENOUS

## 2013-08-06 MED ORDER — IOHEXOL 300 MG/ML  SOLN
50.0000 mL | Freq: Once | INTRAMUSCULAR | Status: AC | PRN
  Administered 2013-08-06: 50 mL via ORAL

## 2013-08-06 MED ORDER — PROMETHAZINE HCL 25 MG PO TABS: 25.0000 mg | ORAL_TABLET | Freq: Four times a day (QID) | ORAL | Status: DC | PRN

## 2013-08-06 MED ORDER — IOHEXOL 300 MG/ML  SOLN
100.0000 mL | Freq: Once | INTRAMUSCULAR | Status: AC | PRN
  Administered 2013-08-06: 100 mL via INTRAVENOUS

## 2013-08-06 MED ORDER — FAMOTIDINE 20 MG PO TABS: 20.0000 mg | ORAL_TABLET | Freq: Two times a day (BID) | ORAL | Status: DC

## 2013-08-06 MED ORDER — PANTOPRAZOLE SODIUM 40 MG IV SOLR
40.0000 mg | Freq: Once | INTRAVENOUS | Status: AC
  Administered 2013-08-06: 40 mg via INTRAVENOUS
  Filled 2013-08-06: qty 40

## 2013-08-06 MED ORDER — HYDROMORPHONE HCL PF 1 MG/ML IJ SOLN
1.0000 mg | Freq: Once | INTRAMUSCULAR | Status: AC
  Administered 2013-08-06: 1 mg via INTRAVENOUS
  Filled 2013-08-06: qty 1

## 2013-08-06 MED ORDER — HYDROCODONE-ACETAMINOPHEN 5-325 MG PO TABS: 2.0000 | ORAL_TABLET | ORAL | Status: DC | PRN

## 2013-08-06 MED ORDER — SODIUM CHLORIDE 0.9 % IV BOLUS (SEPSIS)
1000.0000 mL | Freq: Once | INTRAVENOUS | Status: AC
  Administered 2013-08-06: 1000 mL via INTRAVENOUS

## 2013-08-06 MED ORDER — ONDANSETRON 4 MG PO TBDP: 4.0000 mg | ORAL_TABLET | Freq: Once | ORAL | Status: DC

## 2013-08-06 NOTE — ED Notes (Signed)
Pt reports sharp stabbing chest pain radiating to left arm and left back and reports palpitations.

## 2013-08-06 NOTE — ED Notes (Addendum)
Pt reports feeling feverish and chills. Pt states he ate cheese pizza and he is lactose intolerant. He began vomiting and now has a sore throat that feels like someone is stabbing his throat. He reports that he feels like he has something stuck in his throat and he says he is having shortness of breath.

## 2013-08-06 NOTE — ED Notes (Signed)
Pt reports n/v; sts is lactose intolerant and ate cheese last night, and started throwing up this morning and now has "severly sore throat". Pt also sts "I think my asthma is acting up"

## 2013-08-06 NOTE — ED Provider Notes (Signed)
CSN: [id number]     Arrival date & time 08/06/13  4098 History   First MD Initiated Contact with Patient 08/06/13 [phone]     Chief Complaint  Patient presents with  . Nausea  . Emesis  . Sore Throat  . Chest Pain   (Consider location/radiation/quality/duration/timing/severity/associated sxs/prior Treatment) Patient is a 28 y.o. male presenting with vomiting, pharyngitis, and chest pain. The history is provided by the patient.  Emesis Associated symptoms: abdominal pain, diarrhea, headaches and myalgias   Sore Throat Associated symptoms include chest pain, abdominal pain and headaches. Pertinent negatives include no shortness of breath.  Chest Pain Associated symptoms: abdominal pain, fatigue, headache, nausea and vomiting   Associated symptoms: no fever and no shortness of breath    patient with acute onset of nausea vomiting and one episode of diarrhea at 4 in the morning. Several episodes of vomiting. Associated with epigastric pain left upper quadrant pain right lower quadrant pain. No blood in either one. Also discomfort in the upper esophagus. The burning sensation. Donald pain rated as 6/10. As stated symptoms came on suddenly at 4 in the morning mostly with nausea and vomiting. Abdominal pain is described as sharp in nature nonradiating. Does not radiate to the back. Patient has a history of lactose intolerance and is not sure if that's the cause but there is minimal diarrhea associated with this.  Past Medical History  Diagnosis Date  . Allergy   . Asthma   . Anxiety   . Depression    History reviewed. No pertinent past surgical history. No family history on file. History  Substance Use Topics  . Smoking status: Never Smoker   . Smokeless tobacco: Not on file  . Alcohol Use: No     Comment: quit x 1 yr ago    Review of Systems  Constitutional: Positive for fatigue. Negative for fever.  HENT: Negative for congestion.   Eyes: Negative for redness.  Respiratory: Negative  for shortness of breath.   Cardiovascular: Positive for chest pain.  Gastrointestinal: Positive for nausea, vomiting, abdominal pain and diarrhea.  Endocrine: Negative for polydipsia and polyuria.  Genitourinary: Negative for dysuria.  Musculoskeletal: Positive for myalgias.  Skin: Negative for rash.  Neurological: Positive for headaches.  Hematological: Does not bruise/bleed easily.  Psychiatric/Behavioral: Negative for confusion.    Allergies  Penicillins; Lactose intolerance (gi); and Whey  Home Medications   Current Outpatient Rx  Name  Route  Sig  Dispense  Refill  . albuterol (PROVENTIL HFA;VENTOLIN HFA) 108 (90 BASE) MCG/ACT inhaler   Inhalation   Inhale 1 puff into the lungs every 6 (six) hours as needed for wheezing or shortness of breath.         . Multiple Vitamin (MULITIVITAMIN WITH MINERALS) TABS   Oral   Take 1 tablet by mouth daily.         . famotidine (PEPCID) 20 MG tablet   Oral   Take 1 tablet (20 mg total) by mouth 2 (two) times daily.   30 tablet   0   . HYDROcodone-acetaminophen (NORCO/VICODIN) 5-325 MG per tablet   Oral   Take 2 tablets by mouth every 4 (four) hours as needed for moderate pain.   10 tablet   0   . ondansetron (ZOFRAN ODT) 4 MG disintegrating tablet   Oral   Take 1 tablet (4 mg total) by mouth every 8 (eight) hours as needed for nausea or vomiting.   10 tablet   0   .  promethazine (PHENERGAN) 25 MG tablet   Oral   Take 1 tablet (25 mg total) by mouth every 6 (six) hours as needed for nausea or vomiting.   10 tablet   1    BP 118/76  Pulse 102  Temp(Src) 98 F (36.7 C) (Oral)  Resp 16  SpO2 97% Physical Exam  Nursing note and vitals reviewed. Constitutional: He is oriented to person, place, and time. He appears well-developed and well-nourished. No distress.  HENT:  Head: Normocephalic and atraumatic.  Mouth/Throat: Oropharynx is clear and moist.  Eyes: Conjunctivae and EOM are normal. Pupils are equal, round,  and reactive to light.  Neck: Normal range of motion.  Cardiovascular: Normal rate, regular rhythm and normal heart sounds.   No murmur heard. Pulmonary/Chest: Effort normal and breath sounds normal. No respiratory distress.  Abdominal: Soft. Bowel sounds are normal. There is no tenderness.  Musculoskeletal: Normal range of motion.  Neurological: He is alert and oriented to person, place, and time. No cranial nerve deficit. He exhibits normal muscle tone. Coordination normal.  Skin: Skin is warm. No rash noted.    ED Course  Procedures (including critical care time) Labs Review Labs Reviewed  COMPREHENSIVE METABOLIC PANEL - Abnormal; Notable for the following:    Glucose, Bld 112 (*)    All other components within normal limits  CBC WITH DIFFERENTIAL  URINALYSIS, ROUTINE W REFLEX MICROSCOPIC  LIPASE, BLOOD  POCT I-STAT TROPONIN I   Results for orders placed during the hospital encounter of 08/06/13  CBC WITH DIFFERENTIAL      Result Value Range   WBC 10.5  4.0 - 10.5 K/uL   RBC 5.45  4.22 - 5.81 MIL/uL   Hemoglobin 16.2  13.0 - 17.0 g/dL   HCT 40.9  81.1 - 91.4 %   MCV 83.9  78.0 - 100.0 fL   MCH 29.7  26.0 - 34.0 pg   MCHC 35.4  30.0 - 36.0 g/dL   RDW 78.2  95.6 - 21.3 %   Platelets 219  150 - 400 K/uL   Neutrophils Relative % 66  43 - 77 %   Neutro Abs 6.9  1.7 - 7.7 K/uL   Lymphocytes Relative 21  12 - 46 %   Lymphs Abs 2.2  0.7 - 4.0 K/uL   Monocytes Relative 8  3 - 12 %   Monocytes Absolute 0.8  0.1 - 1.0 K/uL   Eosinophils Relative 5  0 - 5 %   Eosinophils Absolute 0.5  0.0 - 0.7 K/uL   Basophils Relative 0  0 - 1 %   Basophils Absolute 0.0  0.0 - 0.1 K/uL  COMPREHENSIVE METABOLIC PANEL      Result Value Range   Sodium 139  137 - 147 mEq/L   Potassium 3.7  3.7 - 5.3 mEq/L   Chloride 102  96 - 112 mEq/L   CO2 22  19 - 32 mEq/L   Glucose, Bld 112 (*) 70 - 99 mg/dL   BUN 13  6 - 23 mg/dL   Creatinine, Ser 0.86  0.50 - 1.35 mg/dL   Calcium 9.2  8.4 - 57.8 mg/dL    Total Protein 7.7  6.0 - 8.3 g/dL   Albumin 4.2  3.5 - 5.2 g/dL   AST 23  0 - 37 U/L   ALT 42  0 - 53 U/L   Alkaline Phosphatase 111  39 - 117 U/L   Total Bilirubin 0.3  0.3 - 1.2 mg/dL  GFR calc non Af Amer >90  >90 mL/min   GFR calc Af Amer >90  >90 mL/min  URINALYSIS, ROUTINE W REFLEX MICROSCOPIC      Result Value Range   Color, Urine YELLOW  YELLOW   APPearance CLEAR  CLEAR   Specific Gravity, Urine 1.013  1.005 - 1.030   pH 6.5  5.0 - 8.0   Glucose, UA NEGATIVE  NEGATIVE mg/dL   Hgb urine dipstick NEGATIVE  NEGATIVE   Bilirubin Urine NEGATIVE  NEGATIVE   Ketones, ur NEGATIVE  NEGATIVE mg/dL   Protein, ur NEGATIVE  NEGATIVE mg/dL   Urobilinogen, UA 0.2  0.0 - 1.0 mg/dL   Nitrite NEGATIVE  NEGATIVE   Leukocytes, UA NEGATIVE  NEGATIVE  LIPASE, BLOOD      Result Value Range   Lipase 17  11 - 59 U/L  POCT I-STAT TROPONIN I      Result Value Range   Troponin i, poc 0.01  0.00 - 0.08 ng/mL   Comment 3            maging Review Dg Chest 2 View  08/06/2013   CLINICAL DATA:  Nausea, emesis, sore throat  EXAM: CHEST  2 VIEW  COMPARISON:  03/24/2012  FINDINGS: The heart size and mediastinal contours are within normal limits. Both lungs are clear. The visualized skeletal structures are unremarkable.  IMPRESSION: No active cardiopulmonary disease.   Electronically Signed   By: Elige KoHetal  Patel   On: 08/06/2013 11:01   Ct Abdomen Pelvis W Contrast  08/06/2013   CLINICAL DATA:  Fever, chills, nausea and vomiting for 1 day  EXAM: CT ABDOMEN AND PELVIS WITH CONTRAST  TECHNIQUE: Multidetector CT imaging of the abdomen and pelvis was performed using the standard protocol following bolus administration of intravenous contrast.  CONTRAST:  50mL OMNIPAQUE IOHEXOL 300 MG/ML SOLN, 100mL OMNIPAQUE IOHEXOL 300 MG/ML SOLN  COMPARISON:  None.  FINDINGS: The lung bases are clear.  The liver demonstrates no focal abnormality. There is no intrahepatic or extrahepatic biliary ductal dilatation. The gallbladder  is normal. The spleen demonstrates no focal abnormality. The kidneys, adrenal glands and pancreas are normal. The bladder is unremarkable.  The stomach, duodenum, small intestine, and large intestine demonstrate no contrast extravasation or dilatation. There is a normal caliber appendix in the right lower quadrant without periappendiceal inflammatory changes. There is no pneumoperitoneum, pneumatosis, or portal venous gas. There is no abdominal or pelvic free fluid. There is no lymphadenopathy.  The abdominal aorta is normal in caliber.  There are no lytic or sclerotic osseous lesions.  IMPRESSION: No acute abdominal or pelvic pathology.   Electronically Signed   By: Elige KoHetal  Patel   On: 08/06/2013 11:55    EKG Interpretation    Date/Time:  Thursday August 06 2013 09:34:16 EST Ventricular Rate:  98 PR Interval:  151 QRS Duration: 77 QT Interval:  323 QTC Calculation: 412 R Axis:   82 Text Interpretation:  Sinus rhythm ST elev, probable normal early repol pattern Baseline wander in lead(s) V2 No significant change since last tracing Confirmed by Bertrum Helmstetter  MD, Katara Griner ([id number]) on 08/06/2013 10:15:30 AM            MDM   1. Gastritis    Patient with possible viral gastritis. Improved in the emergency part with fluids pain medicine and nausea medicine. CT of the abdomen without any significant abnormalities no explanation for left upper quadrant and right lower quadrant abdominal pain. Patient's labs without significant abnormalities no leukocytosis. Patient  will be treated symptomatically.    Shelda Jakes, MD 08/06/13 1323

## 2013-08-06 NOTE — ED Notes (Signed)
Pt aware of the need of a urine sample, pt unable to void at this time.

## 2013-08-06 NOTE — Discharge Instructions (Signed)
Gastritis, Adult Gastritis is soreness and puffiness (inflammation) of the lining of the stomach. If you do not get help, gastritis can cause bleeding and sores (ulcers) in the stomach. HOME CARE   Only take medicine as told by your doctor.  If you were given antibiotic medicines, take them as told. Finish the medicines even if you start to feel better.  Drink enough fluids to keep your pee (urine) clear or pale yellow.  Avoid foods and drinks that make your problems worse. Foods you may want to avoid include:  Caffeine or alcohol.  Chocolate.  Mint.  Garlic and onions.  Spicy foods.  Citrus fruits, including oranges, lemons, or limes.  Food containing tomatoes, including sauce, chili, salsa, and pizza.  Fried and fatty foods.  Eat small meals throughout the day instead of large meals. GET HELP RIGHT AWAY IF:   You have black or dark red poop (stools).  You throw up (vomit) blood. It may look like coffee grounds.  You cannot keep fluids down.  Your belly (abdominal) pain gets worse.  You have a fever.  You do not feel better after 1 week.  You have any other questions or concerns. MAKE SURE YOU:   Understand these instructions.  Will watch your condition.  Will get help right away if you are not doing well or get worse. Document Released: 01/09/2008 Document Revised: 10/15/2011 Document Reviewed: 09/05/2011 [facility] Patient Information 2014 [place], [place].  Take medications as directed. Return if not for worse symptoms or if not improving in 24 hours. Abdominal CT without any specific findings. Chest x-ray negative. Labs without significant abnormalities.

## 2013-11-08 ENCOUNTER — Ambulatory Visit (INDEPENDENT_AMBULATORY_CARE_PROVIDER_SITE_OTHER): Payer: No Typology Code available for payment source | Admitting: Internal Medicine

## 2013-11-08 VITALS — BP 116/74 | HR 80 | Temp 97.4°F | Resp 18 | Ht 72.0 in | Wt 197.0 lb

## 2013-11-08 DIAGNOSIS — J45901 Unspecified asthma with (acute) exacerbation: Secondary | ICD-10-CM

## 2013-11-08 DIAGNOSIS — J9801 Acute bronchospasm: Secondary | ICD-10-CM

## 2013-11-08 DIAGNOSIS — J301 Allergic rhinitis due to pollen: Secondary | ICD-10-CM

## 2013-11-08 MED ORDER — CETIRIZINE HCL 10 MG PO TABS: 10.0000 mg | ORAL_TABLET | Freq: Every day | ORAL | Status: DC

## 2013-11-08 MED ORDER — FLUTICASONE PROPIONATE 50 MCG/ACT NA SUSP: NASAL | Status: DC

## 2013-11-08 MED ORDER — ALBUTEROL SULFATE HFA 108 (90 BASE) MCG/ACT IN AERS: 1.0000 | INHALATION_SPRAY | Freq: Four times a day (QID) | RESPIRATORY_TRACT | Status: DC | PRN

## 2013-11-08 MED ORDER — PREDNISONE 20 MG PO TABS: ORAL_TABLET | ORAL | Status: DC

## 2013-11-08 MED ORDER — BUDESONIDE-FORMOTEROL FUMARATE 160-4.5 MCG/ACT IN AERO: 2.0000 | INHALATION_SPRAY | Freq: Two times a day (BID) | RESPIRATORY_TRACT | Status: DC

## 2013-11-08 NOTE — Progress Notes (Signed)
Subjective:    Patient ID: Brad Davidson, male    DOB: [birth year], 28 y.o.   MRN: [id number]   Asthma He complains of shortness of breath and wheezing. His past medical history is significant for asthma.  Wheezing  Associated symptoms include shortness of breath. His past medical history is significant for asthma.   Chief Complaint  Patient presents with  . Asthma    tightness with sob   . Wheezing  . rx refills    inhaler feels like its not helping     This chart was scribed for Brad Siaobert Reace Breshears, MD by Andrew Auaven Small, ED Scribe. This patient was seen in room 5 and the patient's care was started at 1:27 PM.  HPI Comments: Brad GarbeZachary Davidson is a 28 y.o. male who presents to the [facility] complaining of allergies and asthma. pt reports that he has been having trouble with his asthma due to the seasonal change. Pt reports that he has a deviated septum and reports that he has trouble breathing. Pt reports SOB and wheezing. Pt reports that he has lost his symbicort inhaler.   Past Medical History  Diagnosis Date  . Allergy   . Asthma   . Anxiety   . Depression    Allergies  Allergen Reactions  . Penicillins Hives  . Lactose Intolerance (Gi) Nausea Only  . Whey Nausea Only   Prior to Admission medications   Medication Sig Start Date End Date Taking? Authorizing Provider  albuterol (PROVENTIL HFA;VENTOLIN HFA) 108 (90 BASE) MCG/ACT inhaler Inhale 1 puff into the lungs every 6 (six) hours as needed for wheezing or shortness of breath.   Yes Historical Provider, MD  Multiple Vitamin (MULITIVITAMIN WITH MINERALS) TABS Take 1 tablet by mouth daily.   Yes Historical Provider, MD  famotidine (PEPCID) 20 MG tablet Take 1 tablet (20 mg total) by mouth 2 (two) times daily. 08/06/13   Shelda JakesScott W. Zackowski, MD  HYDROcodone-acetaminophen (NORCO/VICODIN) 5-325 MG per tablet Take 2 tablets by mouth every 4 (four) hours as needed for moderate pain. 08/06/13   Shelda JakesScott W. Zackowski, MD    ondansetron (ZOFRAN ODT) 4 MG disintegrating tablet Take 1 tablet (4 mg total) by mouth every 8 (eight) hours as needed for nausea or vomiting. 08/06/13   Shelda JakesScott W. Zackowski, MD  promethazine (PHENERGAN) 25 MG tablet Take 1 tablet (25 mg total) by mouth every 6 (six) hours as needed for nausea or vomiting. 08/06/13   Shelda JakesScott W. Zackowski, MD   Review of Systems  Respiratory: Positive for shortness of breath and wheezing.   Allergic/Immunologic: Positive for environmental allergies.      Objective:   Physical Exam  Nursing note and vitals reviewed. Constitutional: He is oriented to person, place, and time. He appears well-developed and well-nourished. No distress.  HENT:  Head: Normocephalic and atraumatic.  Right Ear: External ear normal.  Left Ear: External ear normal.  Nose: Mucosal edema and rhinorrhea present.  Mouth/Throat: Oropharynx is clear and moist.  Nares have minor air space due to edema with clear rhinorrhea   Eyes: EOM are normal.  Conjunctivas injected and red  Neck: Neck supple. No tracheal deviation present.  Cardiovascular: Normal rate and regular rhythm.   Pulmonary/Chest: Effort normal. No respiratory distress. He has wheezes ( bilateral).  Musculoskeletal: Normal range of motion.  Lymphadenopathy:    He has no cervical adenopathy.  Neurological: He is alert and oriented to person, place, and time.  Skin: Skin is warm and dry.  Psychiatric: He has a normal mood and affect. His behavior is normal.   Filed Vitals:   11/08/13 1228  BP: 116/74  Pulse: 80  Temp: 97.4 F (36.3 C)  Resp: 18      Assessment & Plan:    I have completed the patient encounter in its entirety as documented by the scribe, with editing by me where necessary. Leiann Sporer P. Merla Riches, M.D.   1. Acute bronchospasm   2. Asthma with acute exacerbation   3. Allergic rhinitis due to pollen      Meds ordered this encounter  Medications  . albuterol (PROVENTIL HFA;VENTOLIN HFA) 108 (90  BASE) MCG/ACT inhaler    Sig: Inhale 1 puff into the lungs every 6 (six) hours as needed for wheezing or shortness of breath.    Dispense:  1 Inhaler    Refill:  11  . budesonide-formoterol (SYMBICORT) 160-4.5 MCG/ACT inhaler    Sig: Inhale 2 puffs into the lungs 2 (two) times daily.    Dispense:  1 Inhaler    Refill:  6  . fluticasone (FLONASE) 50 MCG/ACT nasal spray    Sig: 1 spray each nostril twice a day yearround    Dispense:  16 g    Refill:  11  . predniSONE (DELTASONE) 20 MG tablet    Sig: 3/3/2/2/1/1 single daily dose for 6 days    Dispense:  12 tablet    Refill:  0  . cetirizine (ZYRTEC) 10 MG tablet    Sig: Take 1 tablet (10 mg total) by mouth daily.    Dispense:  30 tablet    Refill:  11

## 2013-11-08 NOTE — Patient Instructions (Signed)
Read about the use of Singulair

## 2013-11-13 ENCOUNTER — Telehealth: Payer: Self-pay

## 2013-11-13 NOTE — Telephone Encounter (Signed)
PA was denied for Symbicort because Dulera (preferred alternative) has not been tried/failed. The only hx in EPIC is pt having tried QVAR in the past (other than albuterol). Can someone review this in Dr Doolittle's absence and possibly consider Dulera?

## 2013-11-13 NOTE — Telephone Encounter (Signed)
What was patient's response with Qvar?

## 2013-11-15 NOTE — Telephone Encounter (Signed)
Left message to return call 

## 2013-11-16 NOTE — Telephone Encounter (Signed)
Left message on machine to CB.

## 2013-11-17 NOTE — Telephone Encounter (Signed)
LM for rtn call. 

## 2013-11-25 MED ORDER — MOMETASONE FURO-FORMOTEROL FUM 200-5 MCG/ACT IN AERO: 2.0000 | INHALATION_SPRAY | Freq: Two times a day (BID) | RESPIRATORY_TRACT | Status: DC

## 2013-11-25 NOTE — Telephone Encounter (Signed)
dulera fine

## 2013-11-25 NOTE — Telephone Encounter (Signed)
Pt was most recently on 80 mcg QVAR, so pended for 200-5 mcg Dulera w/ sig recommended in [facility] med book. Is this dose OK?

## 2013-11-25 NOTE — Telephone Encounter (Signed)
Were not able to get call back from pt, but OV notes showed pt put on QVAR 12/06/11, 12/23/11 OV notes said to consider switching to Advair/Symbicort if no improvement. Then pt restarted on QVAR 10/13/12 OV. Dr Merla Richesoolittle Rxd Symbicort at last OV. Dr Merla Richesoolittle, [facility]

## 2013-12-14 ENCOUNTER — Other Ambulatory Visit: Payer: Self-pay | Admitting: Family Medicine

## 2014-06-02 ENCOUNTER — Ambulatory Visit (INDEPENDENT_AMBULATORY_CARE_PROVIDER_SITE_OTHER): Payer: No Typology Code available for payment source | Admitting: Family Medicine

## 2014-06-02 VITALS — BP 118/72 | HR 107 | Temp 98.4°F | Resp 18 | Ht 70.0 in | Wt 220.6 lb

## 2014-06-02 DIAGNOSIS — J4541 Moderate persistent asthma with (acute) exacerbation: Secondary | ICD-10-CM

## 2014-06-02 MED ORDER — METHYLPREDNISOLONE SODIUM SUCC 125 MG IJ SOLR
125.0000 mg | Freq: Once | INTRAMUSCULAR | Status: AC
  Administered 2014-06-02: 125 mg via INTRAMUSCULAR

## 2014-06-02 MED ORDER — HYDROCODONE-HOMATROPINE 5-1.5 MG/5ML PO SYRP: 5.0000 mL | ORAL_SOLUTION | Freq: Three times a day (TID) | ORAL | Status: DC | PRN

## 2014-06-02 MED ORDER — AZITHROMYCIN 250 MG PO TABS: ORAL_TABLET | ORAL | Status: DC

## 2014-06-02 MED ORDER — FLUTICASONE FUROATE 200 MCG/ACT IN AEPB: 1.0000 | INHALATION_SPRAY | Freq: Every day | RESPIRATORY_TRACT | Status: DC

## 2014-06-02 MED ORDER — ALBUTEROL SULFATE (2.5 MG/3ML) 0.083% IN NEBU
5.0000 mg | INHALATION_SOLUTION | Freq: Once | RESPIRATORY_TRACT | Status: AC
  Administered 2014-06-02: 5 mg via RESPIRATORY_TRACT

## 2014-06-02 MED ORDER — PREDNISONE 10 MG PO TABS: ORAL_TABLET | ORAL | Status: DC

## 2014-06-02 MED ORDER — ALBUTEROL SULFATE HFA 108 (90 BASE) MCG/ACT IN AERS: INHALATION_SPRAY | RESPIRATORY_TRACT | Status: DC

## 2014-06-02 NOTE — Progress Notes (Signed)
Subjective:    Patient ID: Brad Davidson, male    DOB: 07/15/1986, 28 y.o.   MRN: [id number] This chart was scribed for Brad SorensonEva Rajinder Mesick, MD by Littie Deedsichard Sun, Medical Scribe. This patient was seen in Room 8 and the patient's care was started at 3:26 PM.  Chief Complaint  Patient presents with  . Wheezing    x3 weeks, overusing albuterol   . Shortness of Breath    "tightness", worse after eating sometimes, keeping him up at night x3 weeks  . Cough    Productive- Brown/yellow, x3 days    HPI HPI Comments: Brad GarbeZachary Herbster is a 28 y.o. male who presents to the [facility] complaining of exacerbation to his asthma that began 2-3 weeks. Patient reports having associated wheezing, subjective fever, SOB and productive cough of brown/yellow mucus. The symptoms are worse with sitting, and better when standing up. He also notes having difficulty sleeping every night due to his symptoms, only getting about 6 hours of sleep per night. His asthma last flared up 6 months ago. He denies hx of smoking.  Patient has allergies to penicillin. He was prescribed Dulera 2 puffs a day in the past in addition to Flonase nasal spray and Zyrtec, but he is no longer taking those; he only takes prn Ventolin (about 10 puffs a day). [facility].  Past Medical History  Diagnosis Date  . Allergy   . Asthma   . Anxiety   . Depression    Current Outpatient Prescriptions on File Prior to Visit  Medication Sig Dispense Refill  . cetirizine (ZYRTEC) 10 MG tablet Take 1 tablet (10 mg total) by mouth daily.  30 tablet  11  . famotidine (PEPCID) 20 MG tablet Take 1 tablet (20 mg total) by mouth 2 (two) times daily.  30 tablet  0  . fluticasone (FLONASE) 50 MCG/ACT nasal spray 1 spray each nostril twice a day yearround  16 g  11  . HYDROcodone-acetaminophen (NORCO/VICODIN) 5-325 MG per tablet Take 2 tablets by mouth every 4 (four) hours as needed for moderate pain.  10 tablet   0  . mometasone-formoterol (DULERA) 200-5 MCG/ACT AERO Inhale 2 puffs into the lungs 2 (two) times daily.  13 g  5  . Multiple Vitamin (MULITIVITAMIN WITH MINERALS) TABS Take 1 tablet by mouth daily.      . promethazine (PHENERGAN) 25 MG tablet Take 1 tablet (25 mg total) by mouth every 6 (six) hours as needed for nausea or vomiting.  10 tablet  1   No current facility-administered medications on file prior to visit.   Allergies  Allergen Reactions  . Penicillins Hives  . Lactose Intolerance (Gi) Nausea Only  . Whey Nausea Only     Review of Systems  Constitutional: Positive for fever. Negative for fatigue.  HENT: Negative for congestion, ear discharge and sinus pressure.   Eyes: Negative for discharge.  Respiratory: Positive for cough, shortness of breath and wheezing.   Cardiovascular: Negative for chest pain.  Gastrointestinal: Negative for abdominal pain and diarrhea.  Genitourinary: Negative for frequency and hematuria.  Musculoskeletal: Negative for back pain.  Skin: Negative for rash.  Neurological: Negative for seizures and headaches.  Psychiatric/Behavioral: Positive for sleep disturbance. Negative for hallucinations.       Objective:  BP 118/72  Pulse 80  Temp(Src) 98.4 F (36.9 C) (Oral)  Resp 18  Ht 5\' 10"  (1.778 m)  Wt 220 lb 9.6  oz (100.064 kg)  BMI 31.65 kg/m2  SpO2 97%  Physical Exam  Nursing note and vitals reviewed. Constitutional: He is oriented to person, place, and time. He appears well-developed and well-nourished. No distress.  HENT:  Head: Normocephalic and atraumatic.  Mouth/Throat: No oropharyngeal exudate.  Ears normal. Nasal mucosal edema bilaterally. Orophayrngeal erythema, no exudate.  Eyes: Pupils are equal, round, and reactive to light.  Neck: Neck supple.  Cardiovascular: Regular rhythm, S1 normal and S2 normal.  Tachycardia present.   No murmur heard. Pulmonary/Chest: Effort normal and breath sounds normal. No respiratory distress.  He has no wheezes. He has no rales.  Musculoskeletal: He exhibits no edema.  Neurological: He is alert and oriented to person, place, and time. No cranial nerve deficit.  Skin: Skin is warm and dry. No rash noted.  Psychiatric: He has a normal mood and affect. His behavior is normal.          Assessment & Plan:   Asthma, moderate persistent, with acute exacerbation - Plan: albuterol (PROVENTIL) (2.5 MG/3ML) 0.083% nebulizer solution 5 mg, methylPREDNISolone sodium succinate (SOLU-MEDROL) 125 mg/2 mL injection 125 mg Given 2 alb neb vials and solumedrol 125 IM x 1 in office. Breath sounds improved with more air movement after. Start zpack today and prednisone taper tomorrow.  Begin steroid inhaler in 3-4d - if to expensive despite coupon - call so we can switch to whatever insurance will authorize (did not cover the Qvar he was rx'ed last yr).  Rec get flu shot in 3 wks (2 wks after steroid course is complete.)  RTC immed if worsening.   Meds ordered this encounter  Medications  . albuterol (VENTOLIN HFA) 108 (90 BASE) MCG/ACT inhaler    Sig: Inhale two puffs by mouth every four hours as needed shortness of breath    Dispense:  18 g    Refill:  10  . albuterol (PROVENTIL) (2.5 MG/3ML) 0.083% nebulizer solution 5 mg    Sig:   . methylPREDNISolone sodium succinate (SOLU-MEDROL) 125 mg/2 mL injection 125 mg    Sig:   . azithromycin (ZITHROMAX) 250 MG tablet    Sig: Take 2 tabs PO x 1 dose, then 1 tab PO QD x 4 days    Dispense:  6 tablet    Refill:  0  . predniSONE (DELTASONE) 10 MG tablet    Sig: 6-5-4-3-2-1 tabs po qd    Dispense:  21 tablet    Refill:  0  . HYDROcodone-homatropine (HYCODAN) 5-1.5 MG/5ML syrup    Sig: Take 5 mLs by mouth every 8 (eight) hours as needed for cough.    Dispense:  120 mL    Refill:  0  . Fluticasone Furoate (ARNUITY ELLIPTA) 200 MCG/ACT AEPB    Sig: Inhale 1 puff into the lungs daily.    Dispense:  30 each    Refill:  11    I personally  performed the services described in this documentation, which was scribed in my presence. The recorded information has been reviewed and considered, and addended by me as needed.  Brad SorensonEva Randi Poullard, MD MPH

## 2014-06-02 NOTE — Patient Instructions (Signed)
Asthma, Acute Bronchospasm °Acute bronchospasm caused by asthma is also referred to as an asthma attack. Bronchospasm means your air passages become narrowed. The narrowing is caused by inflammation and tightening of the muscles in the air tubes (bronchi) in your lungs. This can make it hard to breathe or cause you to wheeze and cough. °CAUSES °Possible triggers are: °· Animal dander from the skin, hair, or feathers of animals. °· Dust mites contained in house dust. °· Cockroaches. °· Pollen from trees or grass. °· Mold. °· Cigarette or tobacco smoke. °· Air pollutants such as dust, household cleaners, hair sprays, aerosol sprays, paint fumes, strong chemicals, or strong odors. °· Cold air or weather changes. Cold air may trigger inflammation. Winds increase molds and pollens in the air. °· Strong emotions such as crying or laughing hard. °· Stress. °· Certain medicines such as aspirin or beta-blockers. °· Sulfites in foods and drinks, such as dried fruits and wine. °· Infections or inflammatory conditions, such as a flu, cold, or inflammation of the nasal membranes (rhinitis). °· Gastroesophageal reflux disease (GERD). GERD is a condition where stomach acid backs up into your esophagus. °· Exercise or strenuous activity. °SIGNS AND SYMPTOMS  °· Wheezing. °· Excessive coughing, particularly at night. °· Chest tightness. °· Shortness of breath. °DIAGNOSIS  °Your health care provider will ask you about your medical history and perform a physical exam. A chest X-ray or blood testing may be performed to look for other causes of your symptoms or other conditions that may have triggered your asthma attack.  °TREATMENT  °Treatment is aimed at reducing inflammation and opening up the airways in your lungs.  Most asthma attacks are treated with inhaled medicines. These include quick relief or rescue medicines (such as bronchodilators) and controller medicines (such as inhaled corticosteroids). These medicines are sometimes  given through an inhaler or a nebulizer. Systemic steroid medicine taken by mouth or given through an IV tube also can be used to reduce the inflammation when an attack is moderate or severe. Antibiotic medicines are only used if a bacterial infection is present.  °HOME CARE INSTRUCTIONS  °· Rest. °· Drink plenty of liquids. This helps the mucus to remain thin and be easily coughed up. Only use caffeine in moderation and do not use alcohol until you have recovered from your illness. °· Do not smoke. Avoid being exposed to secondhand smoke. °· You play a critical role in keeping yourself in good health. Avoid exposure to things that cause you to wheeze or to have breathing problems. °· Keep your medicines up-to-date and available. Carefully follow your health care provider's treatment plan. °· Take your medicine exactly as prescribed. °· When pollen or pollution is bad, keep windows closed and use an air conditioner or go to places with air conditioning. °· Asthma requires careful medical care. See your health care provider for a follow-up as advised. If you are more than [redacted] weeks pregnant and you were prescribed any new medicines, let your obstetrician know about the visit and how you are doing. Follow up with your health care provider as directed. °· After you have recovered from your asthma attack, make an appointment with your outpatient doctor to talk about ways to reduce the likelihood of future attacks. If you do not have a doctor who manages your asthma, make an appointment with a primary care doctor to discuss your asthma. °SEEK IMMEDIATE MEDICAL CARE IF:  °· You are getting worse. °· You have trouble breathing. If severe, call your local   emergency services (911 in the [place] °· You develop chest pain or discomfort. °· You are vomiting. °· You are not able to keep fluids down. °· You are coughing up yellow, green, brown, or bloody sputum. °· You have a fever and your symptoms suddenly get worse. °· You have  trouble swallowing. °MAKE SURE YOU:  °· Understand these instructions. °· Will watch your condition. °· Will get help right away if you are not doing well or get worse. °Document Released: 11/07/2006 Document Revised: 07/28/2013 Document Reviewed: 01/28/2013 °[employer]® Patient Information ©2015 [employer]. This information is not intended to replace advice given to you by your health care provider. Make sure you discuss any questions you have with your health care provider. ° °Asthma Attack Prevention °Although there is no way to prevent asthma from starting, you can take steps to control the disease and reduce its symptoms. Learn about your asthma and how to control it. Take an active role to control your asthma by working with your health care provider to create and follow an asthma action plan. An asthma action plan guides you in: °· Taking your medicines properly. °· Avoiding things that set off your asthma or make your asthma worse (asthma triggers). °· Tracking your level of asthma control. °· Responding to worsening asthma. °· Seeking emergency care when needed. °To track your asthma, keep records of your symptoms, check your peak flow number using a handheld device that shows how well air moves out of your lungs (peak flow meter), and get regular asthma checkups.  °WHAT ARE SOME WAYS TO PREVENT AN ASTHMA ATTACK? °· Take medicines as directed by your health care provider. °· Keep track of your asthma symptoms and level of control. °· With your health care provider, write a detailed plan for taking medicines and managing an asthma attack. Then be sure to follow your action plan. Asthma is an ongoing condition that needs regular monitoring and treatment. °· Identify and avoid asthma triggers. Many outdoor allergens and irritants (such as pollen, mold, cold air, and air pollution) can trigger asthma attacks. Find out what your asthma triggers are and take steps to avoid them. °· Monitor your breathing. Learn  to recognize warning signs of an attack, such as coughing, wheezing, or shortness of breath. Your lung function may decrease before you notice any signs or symptoms, so regularly measure and record your peak airflow with a home peak flow meter. °· Identify and treat attacks early. If you act quickly, you are less likely to have a severe attack. You will also need less medicine to control your symptoms. When your peak flow measurements decrease and alert you to an upcoming attack, take your medicine as instructed and immediately stop any activity that may have triggered the attack. If your symptoms do not improve, get medical help. °· Pay attention to increasing quick-relief inhaler use. If you find yourself relying on your quick-relief inhaler, your asthma is not under control. See your health care provider about adjusting your treatment. °WHAT CAN MAKE MY SYMPTOMS WORSE? °A number of common things can set off or make your asthma symptoms worse and cause temporary increased inflammation of your airways. Keep track of your asthma symptoms for several weeks, detailing all the environmental and emotional factors that are linked with your asthma. When you have an asthma attack, go back to your asthma diary to see which factor, or combination of factors, might have contributed to it. Once you know what these factors are, you can   take steps to control many of them. If you have allergies and asthma, it is important to take asthma prevention steps at home. Minimizing contact with the substance to which you are allergic will help prevent an asthma attack. Some triggers and ways to avoid these triggers are: °Animal Dander:  °Some people are allergic to the flakes of skin or dried saliva from animals with fur or feathers.  °· There is no such thing as a hypoallergenic dog or cat breed. All dogs or cats can cause allergies, even if they don't shed. °· Keep these pets out of your home. °· If you are not able to keep a pet  outdoors, keep the pet out of your bedroom and other sleeping areas at all times, and keep the door closed. °· Remove carpets and furniture covered with cloth from your home. If that is not possible, keep the pet away from fabric-covered furniture and carpets. °Dust Mites: °Many people with asthma are allergic to dust mites. Dust mites are tiny bugs that are found in every home in mattresses, pillows, carpets, fabric-covered furniture, bedcovers, clothes, stuffed toys, and other fabric-covered items.  °· Cover your mattress in a special dust-proof cover. °· Cover your pillow in a special dust-proof cover, or wash the pillow each week in hot water. Water must be hotter than 130° F (54.4° C) to kill dust mites. Cold or warm water used with detergent and bleach can also be effective. °· Wash the sheets and blankets on your bed each week in hot water. °· Try not to sleep or lie on cloth-covered cushions. °· Call ahead when traveling and ask for a smoke-free hotel room. Bring your own bedding and pillows in case the hotel only supplies feather pillows and down comforters, which may contain dust mites and cause asthma symptoms. °· Remove carpets from your bedroom and those laid on concrete, if you can. °· Keep stuffed toys out of the bed, or wash the toys weekly in hot water or cooler water with detergent and bleach. °Cockroaches: °Many people with asthma are allergic to the droppings and remains of cockroaches.  °· Keep food and garbage in closed containers. Never leave food out. °· Use poison baits, traps, powders, gels, or paste (for example, boric acid). °· If a spray is used to kill cockroaches, stay out of the room until the odor goes away. °Indoor Mold: °· Fix leaky faucets, pipes, or other sources of water that have mold around them. °· Clean floors and moldy surfaces with a fungicide or diluted bleach. °· Avoid using humidifiers, vaporizers, or swamp coolers. These can spread molds through the air. °Pollen and  Outdoor Mold: °· When pollen or mold spore counts are high, try to keep your windows closed. °· Stay indoors with windows closed from late morning to afternoon. Pollen and some mold spore counts are highest at that time. °· Ask your health care provider whether you need to take anti-inflammatory medicine or increase your dose of the medicine before your allergy season starts. °Other Irritants to Avoid: °· Tobacco smoke is an irritant. If you smoke, ask your health care provider how you can quit. Ask family members to quit smoking, too. Do not allow smoking in your home or car. °· If possible, do not use a wood-burning stove, kerosene heater, or fireplace. Minimize exposure to all sources of smoke, including incense, candles, fires, and fireworks. °· Try to stay away from strong odors and sprays, such as perfume, talcum powder, hair spray, and   paints. °· Decrease humidity in your home and use an indoor air cleaning device. Reduce indoor humidity to below 60%. Dehumidifiers or central air conditioners can do this. °· Decrease house dust exposure by changing furnace and air cooler filters frequently. °· Try to have someone else vacuum for you once or twice a week. Stay out of rooms while they are being vacuumed and for a short while afterward. °· If you vacuum, use a dust mask from a hardware store, a double-layered or microfilter vacuum cleaner bag, or a vacuum cleaner with a HEPA filter. °· Sulfites in foods and beverages can be irritants. Do not drink beer or wine or eat dried fruit, processed potatoes, or shrimp if they cause asthma symptoms. °· Cold air can trigger an asthma attack. Cover your nose and mouth with a scarf on cold or windy days. °· Several health conditions can make asthma more difficult to manage, including a runny nose, sinus infections, reflux disease, psychological stress, and sleep apnea. Work with your health care provider to manage these conditions. °· Avoid close contact with people who have  a respiratory infection such as a cold or the flu, since your asthma symptoms may get worse if you catch the infection. Wash your hands thoroughly after touching items that may have been handled by people with a respiratory infection. °· Get a flu shot every year to protect against the flu virus, which often makes asthma worse for days or weeks. Also get a pneumonia shot if you have not previously had one. Unlike the flu shot, the pneumonia shot does not need to be given yearly. °Medicines: °· Talk to your health care provider about whether it is safe for you to take aspirin or non-steroidal anti-inflammatory medicines (NSAIDs). In a small number of people with asthma, aspirin and NSAIDs can cause asthma attacks. These medicines must be avoided by people who have known aspirin-sensitive asthma. It is important that people with aspirin-sensitive asthma read labels of all over-the-counter medicines used to treat pain, colds, coughs, and fever. °· Beta-blockers and ACE inhibitors are other medicines you should discuss with your health care provider. °HOW CAN I FIND OUT WHAT I AM ALLERGIC TO? °Ask your asthma health care provider about allergy skin testing or blood testing (the RAST test) to identify the allergens to which you are sensitive. If you are found to have allergies, the most important thing to do is to try to avoid exposure to any allergens that you are sensitive to as much as possible. Other treatments for allergies, such as medicines and allergy shots (immunotherapy) are available.  °CAN I EXERCISE? °Follow your health care provider's advice regarding asthma treatment before exercising. It is important to maintain a regular exercise program, but vigorous exercise or exercise in cold, humid, or dry environments can cause asthma attacks, especially for those people who have exercise-induced asthma. °Document Released: 07/11/2009 Document Revised: 07/28/2013 Document Reviewed: 01/28/2013 °[employer]® Patient  Information ©2015 [employer]. This information is not intended to replace advice given to you by your health care provider. Make sure you discuss any questions you have with your health care provider. ° °

## 2014-12-08 ENCOUNTER — Ambulatory Visit: Payer: 59

## 2014-12-21 IMAGING — CT CT ABD-PELV W/ CM
1 of 2 series · 15 of 32 positions shown, 19 images · IV contrast (OMNIPAQUE 300)
Comparison: None.

CLINICAL DATA: Fever, chills, nausea and vomiting for 1 day

EXAM:
CT ABDOMEN AND PELVIS WITH CONTRAST
TECHNIQUE: Multidetector CT imaging of the abdomen and pelvis was performed
using the standard protocol following bolus administration of
intravenous contrast.
CONTRAST:  50mL OMNIPAQUE IOHEXOL 300 MG/ML SOLN, 100mL OMNIPAQUE
IOHEXOL 300 MG/ML SOLN

[Series 2: abd/pel with · axial · 0.70mm/px · z∈[-475,-30]mm · 15 of 97 slices shown, 19 images]
[im 4/97  soft-tissue]
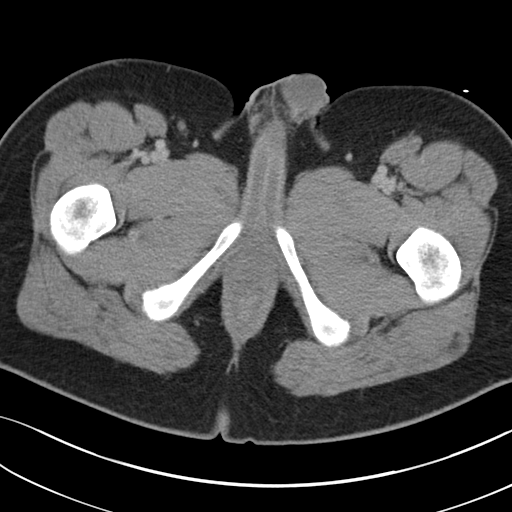
[im 4/97  bone]
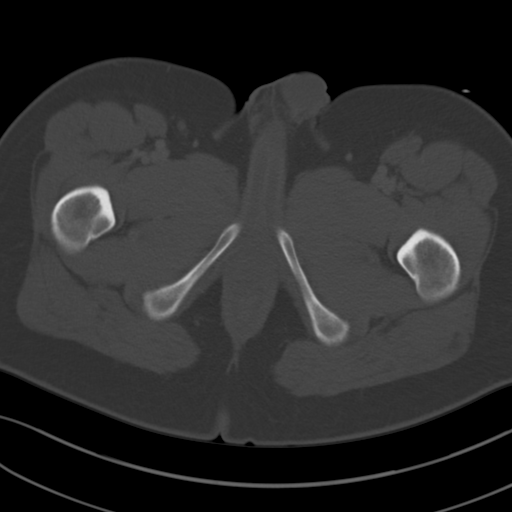
[im 12/97  soft-tissue]
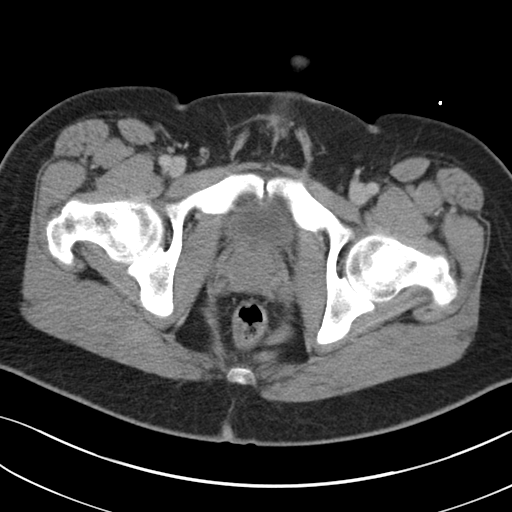
[im 19/97  soft-tissue]
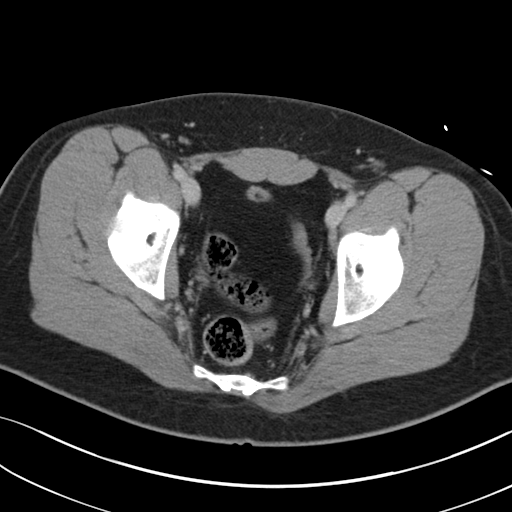
[im 26/97  soft-tissue]
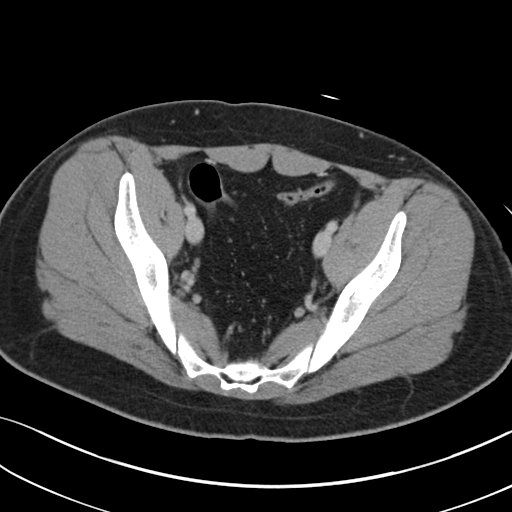
[im 34/97  soft-tissue]
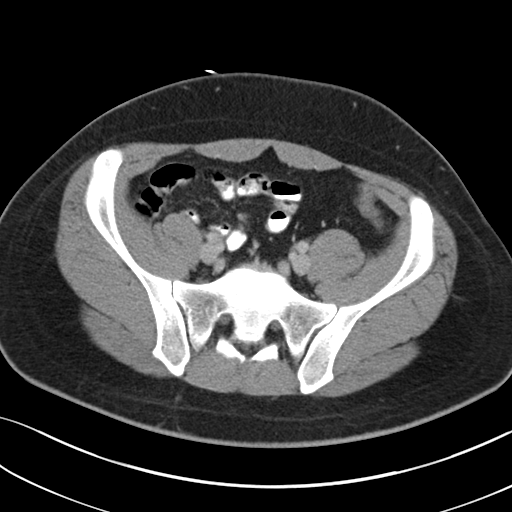
[im 41/97  soft-tissue]
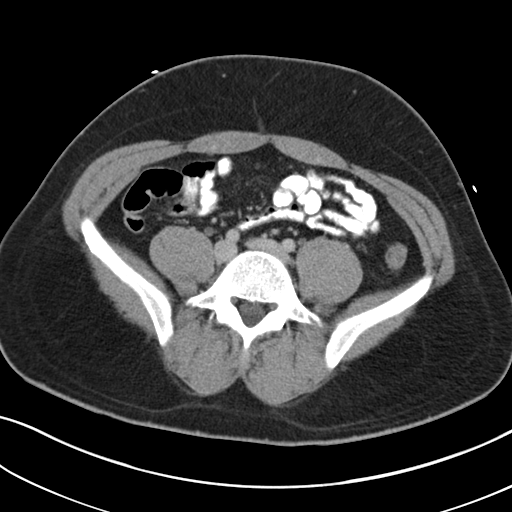
[im 49/97  soft-tissue]
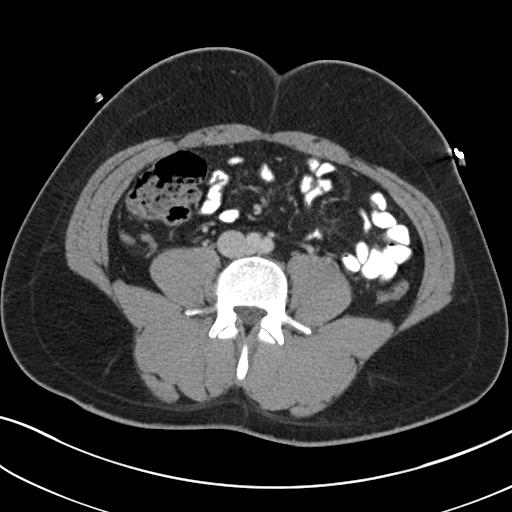
[im 56/97  soft-tissue]
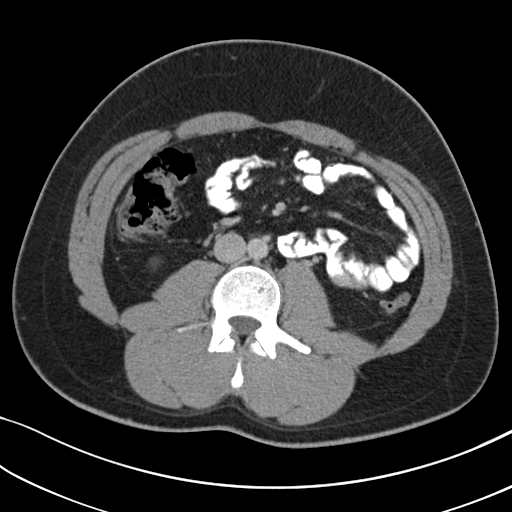
[im 63/97  soft-tissue]
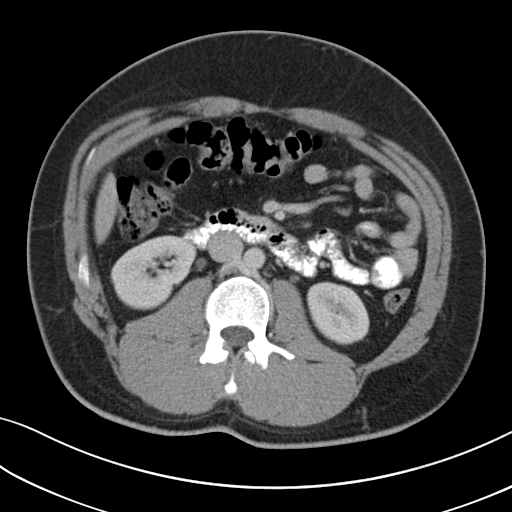
[im 63/97  bone]
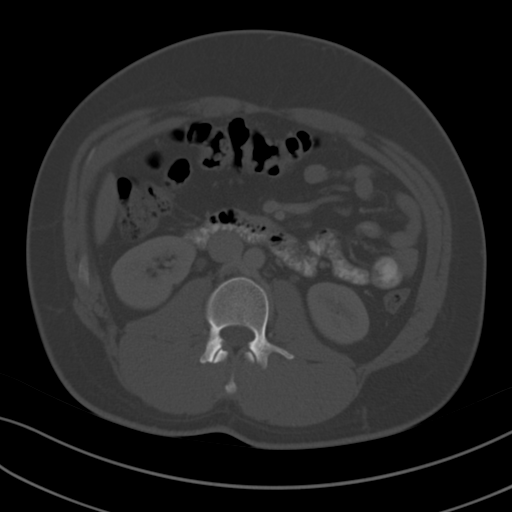
[im 71/97  soft-tissue]
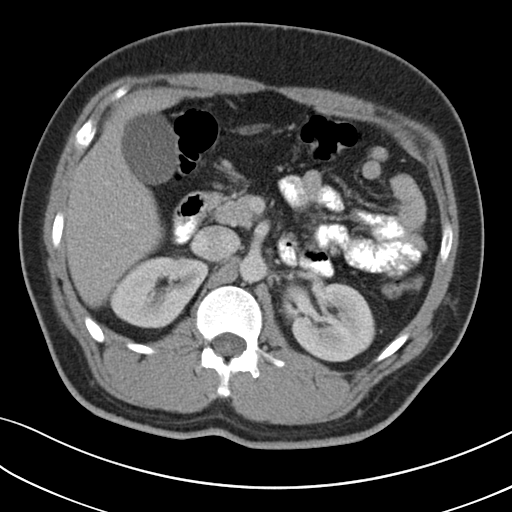
[im 78/97  soft-tissue]
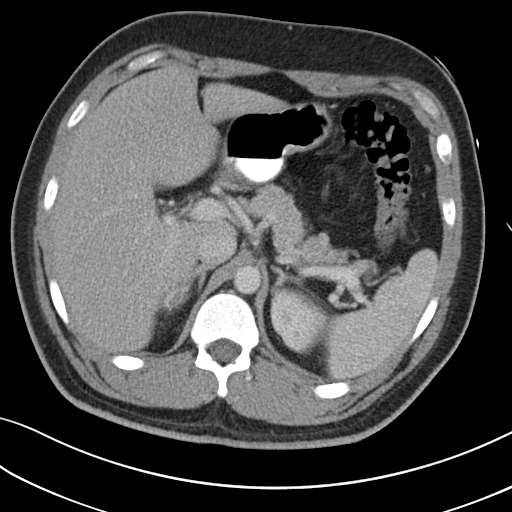
[im 82/97  lung]
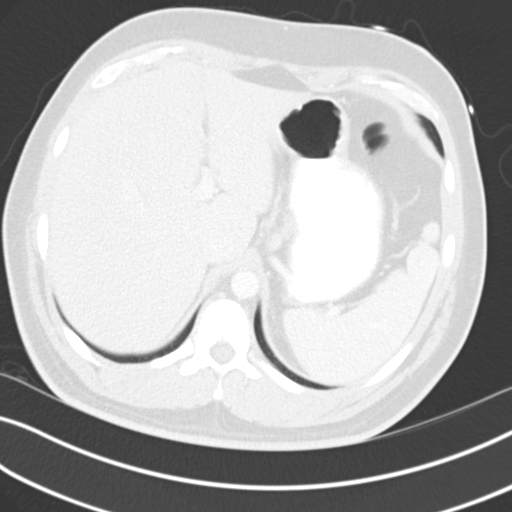
[im 85/97  soft-tissue]
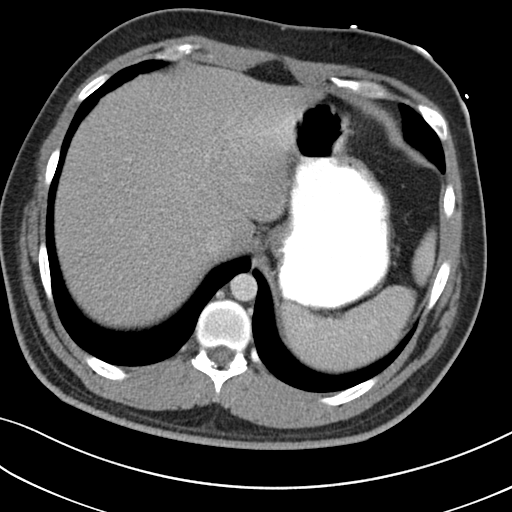
[im 85/97  lung]
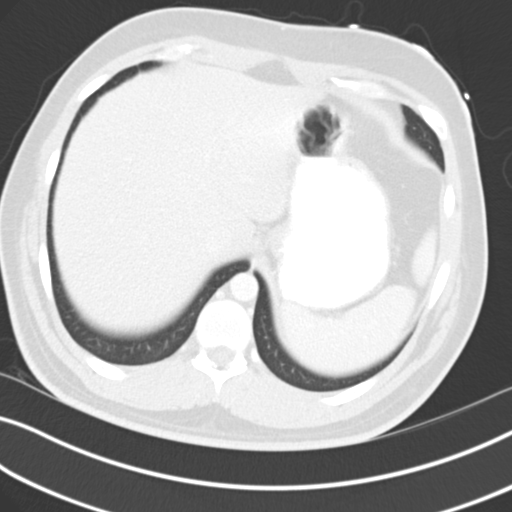
[im 89/97  lung]
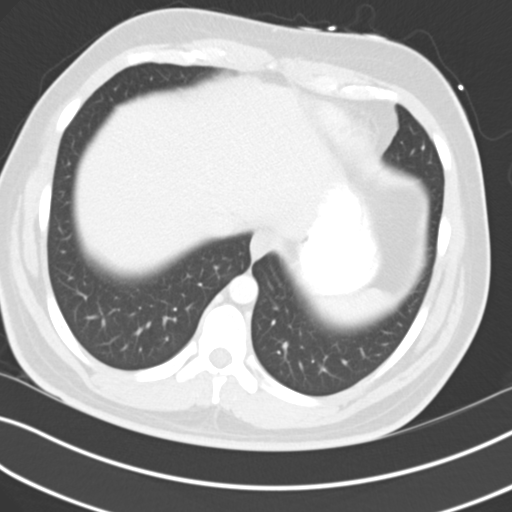
[im 93/97  soft-tissue]
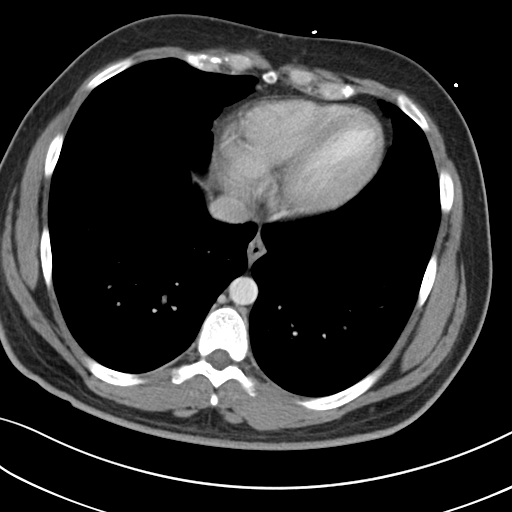
[im 93/97  lung]
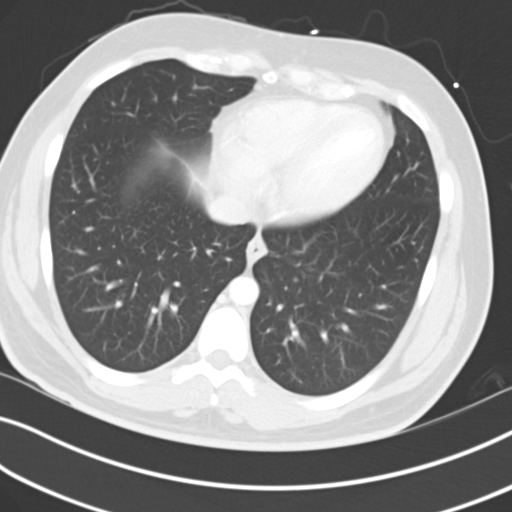

[15 of 32 positions shown; findings below may reference images not displayed]

FINDINGS: The lung bases are clear.

The liver demonstrates no focal abnormality. There is no
intrahepatic or extrahepatic biliary ductal dilatation. The
gallbladder is normal. The spleen demonstrates no focal abnormality.
The kidneys, adrenal glands and pancreas are normal. The bladder is
unremarkable.

The stomach, duodenum, small intestine, and large intestine
demonstrate no contrast extravasation or dilatation. There is a
normal caliber appendix in the right lower quadrant without
periappendiceal inflammatory changes. There is no pneumoperitoneum,
pneumatosis, or portal venous gas. There is no abdominal or pelvic
free fluid. There is no lymphadenopathy.

The abdominal aorta is normal in caliber.

There are no lytic or sclerotic osseous lesions.
IMPRESSION: No acute abdominal or pelvic pathology.

## 2015-03-07 ENCOUNTER — Ambulatory Visit (INDEPENDENT_AMBULATORY_CARE_PROVIDER_SITE_OTHER): Payer: 59 | Admitting: Family Medicine

## 2015-03-07 VITALS — BP 122/80 | HR 84 | Temp 98.2°F | Resp 16 | Ht 73.0 in | Wt 218.2 lb

## 2015-03-07 DIAGNOSIS — R059 Cough, unspecified: Secondary | ICD-10-CM

## 2015-03-07 DIAGNOSIS — R0602 Shortness of breath: Secondary | ICD-10-CM

## 2015-03-07 DIAGNOSIS — J45909 Unspecified asthma, uncomplicated: Secondary | ICD-10-CM | POA: Insufficient documentation

## 2015-03-07 DIAGNOSIS — J302 Other seasonal allergic rhinitis: Secondary | ICD-10-CM

## 2015-03-07 DIAGNOSIS — R062 Wheezing: Secondary | ICD-10-CM | POA: Diagnosis not present

## 2015-03-07 DIAGNOSIS — J4540 Moderate persistent asthma, uncomplicated: Secondary | ICD-10-CM | POA: Diagnosis not present

## 2015-03-07 DIAGNOSIS — R05 Cough: Secondary | ICD-10-CM

## 2015-03-07 MED ORDER — LEVOCETIRIZINE DIHYDROCHLORIDE 5 MG PO TABS: 5.0000 mg | ORAL_TABLET | Freq: Every evening | ORAL | Status: DC

## 2015-03-07 MED ORDER — IPRATROPIUM BROMIDE 0.02 % IN SOLN
0.5000 mg | Freq: Once | RESPIRATORY_TRACT | Status: AC
  Administered 2015-03-07: 0.5 mg via RESPIRATORY_TRACT

## 2015-03-07 MED ORDER — ALBUTEROL SULFATE (2.5 MG/3ML) 0.083% IN NEBU
2.5000 mg | INHALATION_SOLUTION | Freq: Once | RESPIRATORY_TRACT | Status: AC
  Administered 2015-03-07: 2.5 mg via RESPIRATORY_TRACT

## 2015-03-07 NOTE — Patient Instructions (Signed)

## 2015-03-07 NOTE — Progress Notes (Signed)
    MRN: [id number] DOB: 1985/12/19  Subjective:   Brad Davidson is a 29 y.o. male presenting for chief complaint of Wheezing and Fatigue  Reports ~6 month history of wheezing, having shob with increased activity. Patient is not taking his Elwin Sleight because his insurance would not cover it. Using his albuterol daily. Has also had ear fullness and popping. Had a cold in the past 4 days with symptoms including malaise, sore throat, congestion which has responded well to otc cough and flu meds. Denies fever, chest pain, n/v, abdominal pain, itchy or watery red eyes, tooth pain. Denies any other aggravating or relieving factors, no other questions or concerns.  Brad Davidson has a current medication list which includes the following prescription(s): albuterol and multivitamin with minerals. He is allergic to penicillins; lactose intolerance (gi); and whey.  Brad Davidson  has a past medical history of Allergy; Asthma; Anxiety; and Depression. Also  has no past surgical history on file.  ROS As in subjective.  Objective:   Vitals: BP 122/80 mmHg  Pulse 84  Temp(Src) 98.2 F (36.8 C) (Oral)  Resp 16  Ht  (1.854 m)  Wt 218 lb 3.2 oz (98.975 kg)  BMI 28.79 kg/m2  SpO2 98%  Physical Exam  Constitutional: He is oriented to person, place, and time. He appears well-developed and well-nourished.  HENT:  TM's intact bilaterally, no effusions or erythema. Nares patent, nasal turbinates pink and moist. Septum deviated to his left. No sinus tenderness. Oropharynx clear, mucous membranes moist, dentition in good repair.  Eyes: Conjunctivae are normal. Pupils are equal, round, and reactive to light. Right eye exhibits no discharge. Left eye exhibits no discharge. No scleral icterus.  Neck: Normal range of motion. Neck supple.  Cardiovascular: Normal rate, regular rhythm and intact distal pulses.  Exam reveals no gallop and no friction rub.   No murmur heard. Pulmonary/Chest: No stridor. No respiratory  distress. He has no wheezes. He has no rales.  Lymphadenopathy:    He has no cervical adenopathy.  Neurological: He is alert and oriented to person, place, and time.  Skin: Skin is warm and dry. No rash noted. No erythema. No pallor.   Assessment and Plan :   1. Extrinsic asthma, moderate persistent, uncomplicated 2. Cough 3. Shortness of breath 4. Wheezing - Patient had great response to nebulizer treatment, will hold off on steroid course for now. Restart Dulera, samples provided. Advised to use albuterol inhaler 2-3 times per day as needed. Recheck in 1 month. - albuterol (PROVENTIL) (2.5 MG/3ML) 0.083% nebulizer solution 2.5 mg; Take 3 mLs (2.5 mg total) by nebulization once. - ipratropium (ATROVENT) nebulizer solution 0.5 mg; Take 2.5 mLs (0.5 mg total) by nebulization once.  5. Seasonal allergies - Recheck in 1 month - levocetirizine (XYZAL) 5 MG tablet; Take 1 tablet (5 mg total) by mouth every evening.  Dispense: 30 tablet; Refill: 11   Brad Bamberg, PA-C [facility] [phone] 03/07/2015 12:44 PM

## 2015-03-09 NOTE — Progress Notes (Signed)
LMOM to call back Please schedule follow up in 1 month for his asthma with any of the new PA's.

## 2015-04-18 ENCOUNTER — Ambulatory Visit: Payer: 59 | Admitting: Physician Assistant

## 2015-04-25 ENCOUNTER — Ambulatory Visit: Payer: 59 | Admitting: Physician Assistant

## 2015-07-05 ENCOUNTER — Other Ambulatory Visit: Payer: Self-pay

## 2015-07-05 MED ORDER — ALBUTEROL SULFATE HFA 108 (90 BASE) MCG/ACT IN AERS: INHALATION_SPRAY | RESPIRATORY_TRACT | Status: DC

## 2015-08-14 ENCOUNTER — Other Ambulatory Visit: Payer: Self-pay | Admitting: Family Medicine

## 2015-09-12 ENCOUNTER — Other Ambulatory Visit: Payer: Self-pay | Admitting: Family Medicine

## 2015-10-07 ENCOUNTER — Ambulatory Visit (INDEPENDENT_AMBULATORY_CARE_PROVIDER_SITE_OTHER): Payer: BLUE CROSS/BLUE SHIELD | Admitting: Physician Assistant

## 2015-10-07 VITALS — BP 129/90 | HR 73 | Temp 97.9°F | Resp 18 | Ht 71.0 in | Wt 227.0 lb

## 2015-10-07 DIAGNOSIS — J309 Allergic rhinitis, unspecified: Secondary | ICD-10-CM | POA: Diagnosis not present

## 2015-10-07 DIAGNOSIS — J342 Deviated nasal septum: Secondary | ICD-10-CM

## 2015-10-07 DIAGNOSIS — J4540 Moderate persistent asthma, uncomplicated: Secondary | ICD-10-CM | POA: Diagnosis not present

## 2015-10-07 MED ORDER — LORATADINE 10 MG PO TABS: 10.0000 mg | ORAL_TABLET | Freq: Every day | ORAL | Status: AC

## 2015-10-07 MED ORDER — ALBUTEROL SULFATE HFA 108 (90 BASE) MCG/ACT IN AERS: 2.0000 | INHALATION_SPRAY | Freq: Four times a day (QID) | RESPIRATORY_TRACT | Status: DC | PRN

## 2015-10-07 MED ORDER — MOMETASONE FURO-FORMOTEROL FUM 100-5 MCG/ACT IN AERO: 2.0000 | INHALATION_SPRAY | Freq: Two times a day (BID) | RESPIRATORY_TRACT | Status: DC

## 2015-10-07 NOTE — Progress Notes (Signed)
[facility] [address], [place] [id number] [phone]  Date:  10/07/2015   Name:  Brad GarbeZachary Davidson   DOB:  1986/03/30   MRN:  [id number]  PCP:  No PCP Per Patient    Chief Complaint: Medication Refill and Shortness of Breath   History of Present Illness:  This is a 30 y.o. male with PMH allergies and asthma who is presenting for follow up asthma. Pt was given dulera 5-6 months ago but states he tries to use sparingly because "I know steroids aren't good for you". He states he was using regularly then stopped. Asthma got much worse again. He started back on the dulera twice a day 2 weeks ago and starting to help again. He has been using proair which he states he likes better than ventolin. States he is using his proair at least twice a day. Always in the AM and QHS. May use up to 5-6 times a day. He will use before exercise and will often have to use during exercise as well. He does not have problems with allergies but does not take anything for them. He states he is allergic to many things, esp dogs. He does not have pets. He does work delivering soil. He states he is around a lot of dust, which he knows bothers him.   Pt has deviated septum that bothers him a lot. He is wondering if he can get a referral to ENT.  Declined flu vaccine.  Review of Systems:  Review of Systems See HPI  Patient Active Problem List   Diagnosis Date Noted  . Extrinsic asthma 03/07/2015  . Wheezing 03/07/2015  . Allergy history unknown 10/25/2011  . Asthma 10/25/2011    Prior to Admission medications   Medication Sig Start Date End Date Taking? Authorizing Provider  albuterol (VENTOLIN HFA) 108 (90 Base) MCG/ACT inhaler Inhale 2 puffs into the lungs every 4 (four) hours as needed. 08/15/15  Yes Elvina SidleKurt Lauenstein, MD  Multiple Vitamin (MULITIVITAMIN WITH MINERALS) TABS Take 1 tablet by mouth daily.   Yes Historical Provider, MD           Allergies  Allergen Reactions  .  Penicillins Hives  . Lactose Intolerance (Gi) Nausea Only  . Whey Nausea Only    No past surgical history on file.  Social History  Substance Use Topics  . Smoking status: Never Smoker   . Smokeless tobacco: Never Used  . Alcohol Use: No     Comment: quit x 1 yr ago    No family history on file.  Medication list has been reviewed and updated.  Physical Examination:  Physical Exam  Constitutional: He is oriented to person, place, and time. He appears well-developed and well-nourished. No distress.  HENT:  Head: Normocephalic and atraumatic.  Right Ear: Hearing, tympanic membrane, external ear and ear canal normal.  Left Ear: Hearing, tympanic membrane, external ear and ear canal normal.  Nose: Mucosal edema present.  Mouth/Throat: Uvula is midline, oropharynx is clear and moist and mucous membranes are normal.  Deviated septum to left  Eyes: Conjunctivae and lids are normal. Right eye exhibits no discharge. Left eye exhibits no discharge. No scleral icterus.  Cardiovascular: Normal rate, regular rhythm, normal heart sounds and normal pulses.   No murmur heard. Pulmonary/Chest: Effort normal and breath sounds normal. No respiratory distress. He has no wheezes. He has no rhonchi. He has no rales.  Musculoskeletal: Normal range of motion.  Lymphadenopathy:  Head (right side): No submental, no submandibular and no tonsillar adenopathy present.       Head (left side): No submental, no submandibular and no tonsillar adenopathy present.    He has no cervical adenopathy.  Neurological: He is alert and oriented to person, place, and time.  Skin: Skin is warm, dry and intact. No lesion and no rash noted.  Psychiatric: He has a normal mood and affect. His speech is normal and behavior is normal. Thought content normal.    BP 129/90 mmHg  Pulse 73  Temp(Src) 97.9 F (36.6 C)  Resp 18  Ht  (1.803 m)  Wt 227 lb (102.967 kg)  BMI 31.67 kg/m2  SpO2 98%  Assessment and  Plan:  1. Asthma, moderate persistent, uncomplicated 2. Allergic rhinitis Uncontrolled asthma symptoms. Encouraged patient to use dulera BID every day as maintenance. Sounds like pt has made a habit of using albuterol BID - encouraged to use only prn. Pt with uncontrolled allergic rhinitis, which could be contributing to asthmatic sx. Treat with claritin QHS. Return in 3 months for follow up. - mometasone-formoterol (DULERA) 100-5 MCG/ACT AERO; Inhale 2 puffs into the lungs 2 (two) times daily.  Dispense: 1 Inhaler; Refill: 11 - albuterol (PROVENTIL HFA;VENTOLIN HFA) 108 (90 Base) MCG/ACT inhaler; Inhale 2 puffs into the lungs every 6 (six) hours as needed for wheezing or shortness of breath.  Dispense: 1 Inhaler; Refill: 11 - loratadine (CLARITIN) 10 MG tablet; Take 1 tablet (10 mg total) by mouth daily.  Dispense: 30 tablet; Refill: 11  3. Deviated septum - Ambulatory referral to ENT   Roswell Miners. Dyke Brackett, MHS [facility]  10/07/2015

## 2015-10-07 NOTE — Patient Instructions (Signed)
dulera 2 puffs twice a day. Only use albuterol as needed. Take a couple puffs before exercise. Start taking claritin at night before bed. You will get a phone call to make appt with ENT

## 2015-10-09 ENCOUNTER — Encounter: Payer: Self-pay | Admitting: Physician Assistant

## 2016-02-03 ENCOUNTER — Emergency Department (HOSPITAL_COMMUNITY)
Admission: EM | Admit: 2016-02-03 | Discharge: 2016-02-03 | Disposition: A | Discharge status: Home / Self Care | Payer: BLUE CROSS/BLUE SHIELD | Attending: Emergency Medicine | Admitting: Emergency Medicine

## 2016-02-03 ENCOUNTER — Emergency Department (HOSPITAL_COMMUNITY): Payer: BLUE CROSS/BLUE SHIELD

## 2016-02-03 ENCOUNTER — Encounter (HOSPITAL_COMMUNITY): Payer: Self-pay | Admitting: Emergency Medicine

## 2016-02-03 DIAGNOSIS — J45909 Unspecified asthma, uncomplicated: Secondary | ICD-10-CM | POA: Insufficient documentation

## 2016-02-03 DIAGNOSIS — R55 Syncope and collapse: Secondary | ICD-10-CM | POA: Insufficient documentation

## 2016-02-03 LAB — CBC
HEMATOCRIT: 46.2 % (ref 39.0–52.0)
HEMOGLOBIN: 15.5 g/dL (ref 13.0–17.0)
MCH: 29.3 pg (ref 26.0–34.0)
MCHC: 33.5 g/dL (ref 30.0–36.0)
MCV: 87.3 fL (ref 78.0–100.0)
Platelets: 239 10*3/uL (ref 150–400)
RBC: 5.29 MIL/uL (ref 4.22–5.81)
RDW: 12.6 % (ref 11.5–15.5)
WBC: 14.4 10*3/uL — ABNORMAL HIGH (ref 4.0–10.5)

## 2016-02-03 LAB — URINALYSIS, ROUTINE W REFLEX MICROSCOPIC
BILIRUBIN URINE: NEGATIVE
Glucose, UA: NEGATIVE mg/dL
Hgb urine dipstick: NEGATIVE
Ketones, ur: 15 mg/dL — AB
Leukocytes, UA: NEGATIVE
NITRITE: NEGATIVE
PH: 8 (ref 5.0–8.0)
Protein, ur: NEGATIVE mg/dL
SPECIFIC GRAVITY, URINE: 1.028 (ref 1.005–1.030)

## 2016-02-03 LAB — BASIC METABOLIC PANEL
ANION GAP: 6 (ref 5–15)
BUN: 13 mg/dL (ref 6–20)
CO2: 26 mmol/L (ref 22–32)
Calcium: 9.3 mg/dL (ref 8.9–10.3)
Chloride: 107 mmol/L (ref 101–111)
Creatinine, Ser: 0.93 mg/dL (ref 0.61–1.24)
GLUCOSE: 98 mg/dL (ref 65–99)
POTASSIUM: 3.8 mmol/L (ref 3.5–5.1)
Sodium: 139 mmol/L (ref 135–145)

## 2016-02-03 LAB — CBG MONITORING, ED: GLUCOSE-CAPILLARY: 95 mg/dL (ref 65–99)

## 2016-02-03 LAB — D-DIMER, QUANTITATIVE (NOT AT ARMC): D DIMER QUANT: 0.52 ug{FEU}/mL — AB (ref 0.00–0.50)

## 2016-02-03 MED ORDER — MORPHINE SULFATE (PF) 4 MG/ML IV SOLN
4.0000 mg | Freq: Once | INTRAVENOUS | Status: AC
  Administered 2016-02-03: 4 mg via INTRAVENOUS
  Filled 2016-02-03: qty 1

## 2016-02-03 MED ORDER — SODIUM CHLORIDE 0.9 % IV BOLUS (SEPSIS)
1000.0000 mL | Freq: Once | INTRAVENOUS | Status: AC
  Administered 2016-02-03: 1000 mL via INTRAVENOUS

## 2016-02-03 MED ORDER — IOPAMIDOL (ISOVUE-370) INJECTION 76%
INTRAVENOUS | Status: AC
  Administered 2016-02-03: 100 mL
  Filled 2016-02-03: qty 100

## 2016-02-03 NOTE — ED Notes (Signed)
Pt. Reports sharp chest pain and feeling of being overheated when given morphine. Pt. Took around 3 minutes to come back to  Normal after administration.

## 2016-02-03 NOTE — ED Notes (Signed)
CBG 95 

## 2016-02-03 NOTE — Discharge Instructions (Signed)
You need to follow-up with your doctor in the next week.  There may be additional tests that could be run on an outpatient basis.  Return here for recurrent or worsening symptoms.  Syncope Syncope is a medical term for fainting or passing out. This means you lose consciousness and drop to the ground. People are generally unconscious for less than 5 minutes. You may have some muscle twitches for up to 15 seconds before waking up and returning to normal. Syncope occurs more often in older adults, but it can happen to anyone. While most causes of syncope are not dangerous, syncope can be a sign of a serious medical problem. It is important to seek medical care.  CAUSES  Syncope is caused by a sudden drop in blood flow to the brain. The specific cause is often not determined. Factors that can bring on syncope include:  Taking medicines that lower blood pressure.  Sudden changes in posture, such as standing up quickly.  Taking more medicine than prescribed.  Standing in one place for too long.  Seizure disorders.  Dehydration and excessive exposure to heat.  Low blood sugar (hypoglycemia).  Straining to have a bowel movement.  Heart disease, irregular heartbeat, or other circulatory problems.  Fear, emotional distress, seeing blood, or severe pain. SYMPTOMS  Right before fainting, you may:  Feel dizzy or light-headed.  Feel nauseous.  See all white or all black in your field of vision.  Have cold, clammy skin. DIAGNOSIS  Your health care provider will ask about your symptoms, perform a physical exam, and perform an electrocardiogram (ECG) to record the electrical activity of your heart. Your health care provider may also perform other heart or blood tests to determine the cause of your syncope which may include:  Transthoracic echocardiogram (TTE). During echocardiography, sound waves are used to evaluate how blood flows through your heart.  Transesophageal echocardiogram  (TEE).  Cardiac monitoring. This allows your health care provider to monitor your heart rate and rhythm in real time.  Holter monitor. This is a portable device that records your heartbeat and can help diagnose heart arrhythmias. It allows your health care provider to track your heart activity for several days, if needed.  Stress tests by exercise or by giving medicine that makes the heart beat faster. TREATMENT  In most cases, no treatment is needed. Depending on the cause of your syncope, your health care provider may recommend changing or stopping some of your medicines. HOME CARE INSTRUCTIONS  Have someone stay with you until you feel stable.  Do not drive, use machinery, or play sports until your health care provider says it is okay.  Keep all follow-up appointments as directed by your health care provider.  Lie down right away if you start feeling like you might faint. Breathe deeply and steadily. Wait until all the symptoms have passed.  Drink enough fluids to keep your urine clear or pale yellow.  If you are taking blood pressure or heart medicine, get up slowly and take several minutes to sit and then stand. This can reduce dizziness. SEEK IMMEDIATE MEDICAL CARE IF:   You have a severe headache.  You have unusual pain in the chest, abdomen, or back.  You are bleeding from your mouth or rectum, or you have black or tarry stool.  You have an irregular or very fast heartbeat.  You have pain with breathing.  You have repeated fainting or seizure-like jerking during an episode.  You faint when sitting or lying  down.  You have confusion.  You have trouble walking.  You have severe weakness.  You have vision problems. If you fainted, call your local emergency services (911 in [place] Do not drive yourself to the hospital.    This information is not intended to replace advice given to you by your health care provider. Make sure you discuss any questions you have with  your health care provider.   Document Released: 07/23/2005 Document Revised: 12/07/2014 Document Reviewed: 09/21/2011 Elsevier Interactive Patient Education [employer]2016 Elsevier Inc.
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
## 2016-02-03 NOTE — ED Notes (Signed)
Pt. Able to ambulate in hallway independently with no issues.
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
## 2016-02-03 NOTE — ED Notes (Signed)
Pt. Transported to xray at this time.  

## 2016-05-08 ENCOUNTER — Ambulatory Visit (INDEPENDENT_AMBULATORY_CARE_PROVIDER_SITE_OTHER): Payer: BLUE CROSS/BLUE SHIELD | Admitting: Physician Assistant

## 2016-05-08 VITALS — BP 120/90 | HR 77 | Temp 98.0°F | Resp 17 | Ht 71.0 in | Wt 221.0 lb

## 2016-05-08 DIAGNOSIS — R42 Dizziness and giddiness: Secondary | ICD-10-CM | POA: Diagnosis not present

## 2016-05-08 DIAGNOSIS — R0602 Shortness of breath: Secondary | ICD-10-CM | POA: Diagnosis not present

## 2016-05-08 DIAGNOSIS — R112 Nausea with vomiting, unspecified: Secondary | ICD-10-CM

## 2016-05-08 NOTE — Patient Instructions (Signed)
     IF you received an x-ray today, you will receive an invoice from [facility]. Please contact [facility] at [phone] with questions or concerns regarding your invoice.   IF you received labwork today, you will receive an invoice from [facility]. Please contact [facility] at [phone] with questions or concerns regarding your invoice.   Our billing staff will not be able to assist you with questions regarding bills from these companies.  You will be contacted with the lab results as soon as they are available. The fastest way to get your results is to activate your My Chart account. Instructions are located on the last page of this paperwork. If you have not heard from us regarding the results in 2 weeks, please contact this office.      
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
## 2016-11-01 ENCOUNTER — Other Ambulatory Visit: Payer: Self-pay | Admitting: Physician Assistant

## 2016-11-01 DIAGNOSIS — J4540 Moderate persistent asthma, uncomplicated: Secondary | ICD-10-CM

## 2016-11-01 NOTE — Telephone Encounter (Signed)
Pt needs appt with his PCP before more refills.

## 2016-11-05 ENCOUNTER — Other Ambulatory Visit: Payer: Self-pay | Admitting: Physician Assistant

## 2016-11-05 DIAGNOSIS — J4540 Moderate persistent asthma, uncomplicated: Secondary | ICD-10-CM

## 2016-12-27 ENCOUNTER — Other Ambulatory Visit: Payer: Self-pay | Admitting: Physician Assistant

## 2016-12-27 DIAGNOSIS — J4540 Moderate persistent asthma, uncomplicated: Secondary | ICD-10-CM

## 2016-12-29 ENCOUNTER — Encounter: Payer: Self-pay | Admitting: Physician Assistant

## 2016-12-29 ENCOUNTER — Ambulatory Visit (INDEPENDENT_AMBULATORY_CARE_PROVIDER_SITE_OTHER): Payer: Commercial Managed Care - PPO | Admitting: Physician Assistant

## 2016-12-29 VITALS — BP 115/78 | HR 74 | Temp 98.3°F | Resp 16 | Ht 72.0 in | Wt 218.0 lb

## 2016-12-29 DIAGNOSIS — J309 Allergic rhinitis, unspecified: Secondary | ICD-10-CM | POA: Diagnosis not present

## 2016-12-29 DIAGNOSIS — R222 Localized swelling, mass and lump, trunk: Secondary | ICD-10-CM | POA: Diagnosis not present

## 2016-12-29 DIAGNOSIS — J4540 Moderate persistent asthma, uncomplicated: Secondary | ICD-10-CM | POA: Diagnosis not present

## 2016-12-29 MED ORDER — BECLOMETHASONE DIPROPIONATE 80 MCG/ACT IN AERS: 2.0000 | INHALATION_SPRAY | Freq: Two times a day (BID) | RESPIRATORY_TRACT | 2 refills | Status: DC

## 2016-12-29 MED ORDER — ALBUTEROL SULFATE HFA 108 (90 BASE) MCG/ACT IN AERS: 2.0000 | INHALATION_SPRAY | Freq: Four times a day (QID) | RESPIRATORY_TRACT | 3 refills | Status: DC | PRN

## 2016-12-29 MED ORDER — CETIRIZINE HCL 10 MG PO TABS: 10.0000 mg | ORAL_TABLET | Freq: Every day | ORAL | 5 refills | Status: DC

## 2016-12-29 NOTE — Patient Instructions (Addendum)
For asthma, I would like you to start using QVAR 2 puffs twice daily every day. Use albuterol inhaler as needed for wheezing and shortness of breath. Start using zytrec daily for allergies. Follow up in 6 weeks for reevaluation. Thank you for letting me participate in your health and well being.    Asthma, Adult Asthma is a condition of the lungs in which the airways tighten and narrow. Asthma can make it hard to breathe. Asthma cannot be cured, but medicine and lifestyle changes can help control it. Asthma may be started (triggered) by:  Animal skin flakes (dander).  Dust.  Cockroaches.  Pollen.  Mold.  Smoke.  Cleaning products.  Hair sprays or aerosol sprays.  Paint fumes or strong smells.  Cold air, weather changes, and winds.  Crying or laughing hard.  Stress.  Certain medicines or drugs.  Foods, such as dried fruit, potato chips, and sparkling grape juice.  Infections or conditions (colds, flu).  Exercise.  Certain medical conditions or diseases.  Exercise or tiring activities. Follow these instructions at home:  Take medicine as told by your doctor.  Use a peak flow meter as told by your doctor. A peak flow meter is a tool that measures how well the lungs are working.  Record and keep track of the peak flow meter's readings.  Understand and use the asthma action plan. An asthma action plan is a written plan for taking care of your asthma and treating your attacks.  To help prevent asthma attacks:  Do not smoke. Stay away from secondhand smoke.  Change your heating and air conditioning filter often.  Limit your use of fireplaces and wood stoves.  Get rid of pests (such as roaches and mice) and their droppings.  Throw away plants if you see mold on them.  Clean your floors. Dust regularly. Use cleaning products that do not smell.  Have someone vacuum when you are not home. Use a vacuum cleaner with a HEPA filter if possible.  Replace carpet  with wood, tile, or vinyl flooring. Carpet can trap animal skin flakes and dust.  Use allergy-proof pillows, mattress covers, and box spring covers.  Wash bed sheets and blankets every week in hot water and dry them in a dryer.  Use blankets that are made of polyester or cotton.  Clean bathrooms and kitchens with bleach. If possible, have someone repaint the walls in these rooms with mold-resistant paint. Keep out of the rooms that are being cleaned and painted.  Wash hands often. Contact a doctor if:  You have make a whistling sound when breaking (wheeze), have shortness of breath, or have a cough even if taking medicine to prevent attacks.  The colored mucus you cough up (sputum) is thicker than usual.  The colored mucus you cough up changes from clear or white to yellow, green, gray, or bloody.  You have problems from the medicine you are taking such as:  A rash.  Itching.  Swelling.  Trouble breathing.  You need reliever medicines more than 2-3 times a week.  Your peak flow measurement is still at 50-79% of your personal best after following the action plan for 1 hour.  You have a fever. Get help right away if:  You seem to be worse and are not responding to medicine during an asthma attack.  You are short of breath even at rest.  You get short of breath when doing very little activity.  You have trouble eating, drinking, or talking.  You  have chest pain.  You have a fast heartbeat.  Your lips or fingernails start to turn blue.  You are light-headed, dizzy, or faint.  Your peak flow is less than 50% of your personal best. This information is not intended to replace advice given to you by your health care provider. Make sure you discuss any questions you have with your health care provider. Document Released: 01/09/2008 Document Revised: 12/29/2015 Document Reviewed: 02/19/2013 Elsevier Interactive Patient Education  2017 [employer].    IF you received  an x-ray today, you will receive an invoice from [facility]. Please contact [facility] at [phone] with questions or concerns regarding your invoice.   IF you received labwork today, you will receive an invoice from [facility]. Please contact [facility] at [phone] with questions or concerns regarding your invoice.   Our billing staff will not be able to assist you with questions regarding bills from these companies.  You will be contacted with the lab results as soon as they are available. The fastest way to get your results is to activate your My Chart account. Instructions are located on the last page of this paperwork. If you have not heard from [place] regarding the results in 2 weeks, please contact this office.

## 2016-12-29 NOTE — Progress Notes (Signed)
Brad GarbeZachary Davidson  MRN: [id number] DOB: 09/13/1985  Subjective:  Brad Davidson is a 31 y.o. male seen in office today for a chief complaint of medication refill for asthma medications. Has had dx of asthma since at [age]. Has tried both QVAR and Dulera as maintenance inhalers but has been out of [facility] for the past few months. Notes he thinks he did better with QVAR compared to [facility].  Has been out of albuterol inhaler for the past week. Symptoms over the past week have included intermittent chest tightness, SOB, and wheezing. When he was out of his maintenance inhaler, he was using albuterol 6-8 times a day. Dust triggers his symptoms. Denies activity as a trigger. Denies night time awakenings. Denies interference with daily activity. Pt also has uncontrolled seasonal allergies, has sneezing, congestion, watery eyes. Does not take anything daily because it makes him jittery. Denies smoking.   Pt also has a "protuberence" in his left lower back that he wants to get checked out. It has been there for months-years. Denies pain, change in size, redness, warmth, numbness/tingling in legs, saddle anesthesia, bladder/bowel incontinence.   In terms of depression, pt answered "several days" to feeling down, depressed, or hopeless in triage. He notes he feels down because of his health. States without his medications over the past couple weeks, he has felt more down. He is tired of feeling like he cannot breath. He is also feeling fatigued due to his uncontrolled asthma and allergies. Denies any suicidal thoughts/ideations.   Review of Systems  Constitutional: Negative for chills, diaphoresis and fever.  Gastrointestinal: Negative for nausea and vomiting.      Patient Active Problem List   Diagnosis Date Noted  . Asthma, moderate persistent 10/07/2015  . Deviated septum 10/07/2015  . Rhinitis, allergic 10/07/2015    Current Outpatient Prescriptions on File Prior to Visit  Medication Sig Dispense Refill    . PROAIR HFA 108 (90 Base) MCG/ACT inhaler INHALE 2 PUFFS INTO THE LUNGS EVERY 6 (SIX) HOURS AS NEEDED FOR WHEEZING OR SHORTNESS OF BREATH. 8.5 Inhaler 0  . levocetirizine (XYZAL) 5 MG tablet Take 1 tablet (5 mg total) by mouth every evening. (Patient not taking: Reported on 05/08/2016) 30 tablet 11  . loratadine (CLARITIN) 10 MG tablet Take 1 tablet (10 mg total) by mouth daily. (Patient not taking: Reported on 05/08/2016) 30 tablet 11  . mometasone-formoterol (DULERA) 100-5 MCG/ACT AERO Inhale 2 puffs into the lungs 2 (two) times daily. (Patient not taking: Reported on 12/29/2016) 1 Inhaler 11   No current facility-administered medications on file prior to visit.     Allergies  Allergen Reactions  . Lactose Intolerance (Gi) Nausea Only  . Loratadine Nausea And Vomiting, Palpitations and Other (See Comments)    hyperactivity  . Morphine And Related Other (See Comments)    Chest pains, anxiety, burning sensations in vein  . Penicillins Hives  . Whey Nausea Only      Social History   Social History  . Marital status: Single    Spouse name: N/A  . Number of children: N/A  . Years of education: N/A   Occupational History  . Not on file.   Social History Main Topics  . Smoking status: Never Smoker  . Smokeless tobacco: Never Used  . Alcohol use No     Comment: quit x 1 yr ago  . Drug use: No  . Sexual activity: Not on file   Other Topics Concern  . Not on file  Social History Narrative  . No narrative on file    Objective:  BP 115/78   Pulse 74   Temp 98.3 F (36.8 C)   Resp 16   Ht 6' (1.829 m)   Wt 218 lb (98.9 kg)   SpO2 98%   BMI 29.57 kg/m   Physical Exam  Constitutional: He is oriented to person, place, and time and well-developed, well-nourished, and in no distress.  HENT:  Head: Normocephalic and atraumatic.  Right Ear: Tympanic membrane, external ear and ear canal normal.  Left Ear: Tympanic membrane, external ear and ear canal normal.  Nose:  Mucosal edema (moderate) present.  Mouth/Throat: Uvula is midline, oropharynx is clear and moist and mucous membranes are normal.  Eyes: Conjunctivae are normal.  Neck: Normal range of motion.  Cardiovascular: Normal rate, regular rhythm and normal heart sounds.   Pulmonary/Chest: Effort normal and breath sounds normal. He has no wheezes. He has no rhonchi. He has no rales.  Musculoskeletal:       Lumbar back: He exhibits normal range of motion, no tenderness and no swelling.     Neurological: He is alert and oriented to person, place, and time. He has normal reflexes. Gait normal.  Skin: Skin is warm and dry.  3 cm soft, rubbey, mobile subcutaneous mass located in left sided lumbar region. No surrounding erythema, warmth, or tenderness noted.   Psychiatric: Affect normal.  Vitals reviewed.  Office Spirometry Results: Peak Flow: 558 L/min FEV1: 3.92 liters FVC: 5.53 liters FEV1/FVC: 70.9 % FVC  % Predicted: 95 % FEV % Predicted: 70.9 % FeF 25-75: 2.69 liters FeF 25-75 % Predicted: 58  Assessment and Plan :  1. Moderate persistent chronic asthma without complication Lung exam today is CTAB, which is reassuring. Plan to restart QVAR and refill albuterol inhaler. Goal is to decrease frequency of albuterol use. Given educational material on asthma. Will follow up in 6 weeks and repeat PFTs at that time. Return to clinic if symptoms worsen, do not improve, or as needed  - PFT PULM FXN SPIROMETRY (94010) - albuterol (PROAIR HFA) 108 (90 Base) MCG/ACT inhaler; Inhale 2 puffs into the lungs every 6 (six) hours as needed for wheezing or shortness of breath.  Dispense: 8.5 Inhaler; Refill: 3 - beclomethasone (QVAR) 80 MCG/ACT inhaler; Inhale 2 puffs into the lungs 2 (two) times daily.  Dispense: 1 Inhaler; Refill: 2  2. Mass of subcutaneous tissue of back Likely a lipoma. Pt reassured. Continue to monitor, if it changes size or starts to cause issues, plan for [place].   3. Allergic rhinitis,  unspecified seasonality, unspecified trigger It is uncertain if pt has true allergy to allergy medications or if excessive daily use of albuterol inhaler is responsible for making patient feel "jittery." He has tried various allergy medications in the past including Claritin and Allegra. Will attempt zyrtec. Pt encouraged to d/c medication if he experiences side effects. He understands. Will reevaluate in 6 weeks.  - cetirizine (ZYRTEC) 10 MG tablet; Take 1 tablet (10 mg total) by mouth daily.  Dispense: 30 tablet; Refill: 5   Benjiman Core, PA-C  [facility] at [facility] 12/31/2016 10:39 AM

## 2017-02-13 ENCOUNTER — Ambulatory Visit: Payer: Commercial Managed Care - PPO | Admitting: Physician Assistant

## 2017-06-08 ENCOUNTER — Other Ambulatory Visit: Payer: Self-pay | Admitting: Physician Assistant

## 2017-06-08 DIAGNOSIS — J4540 Moderate persistent asthma, uncomplicated: Secondary | ICD-10-CM

## 2017-06-19 IMAGING — CR DG CHEST 2V
2 series · 2 of 2 positions shown · non-contrast
Comparison: PA and lateral chest x-ray August 06, 2013

CLINICAL DATA: Syncopal episode, patient reports shortness of
breath, nausea, and dizziness; history of asthma, nonsmoker

EXAM:
CHEST  2 VIEW

[chest lat]
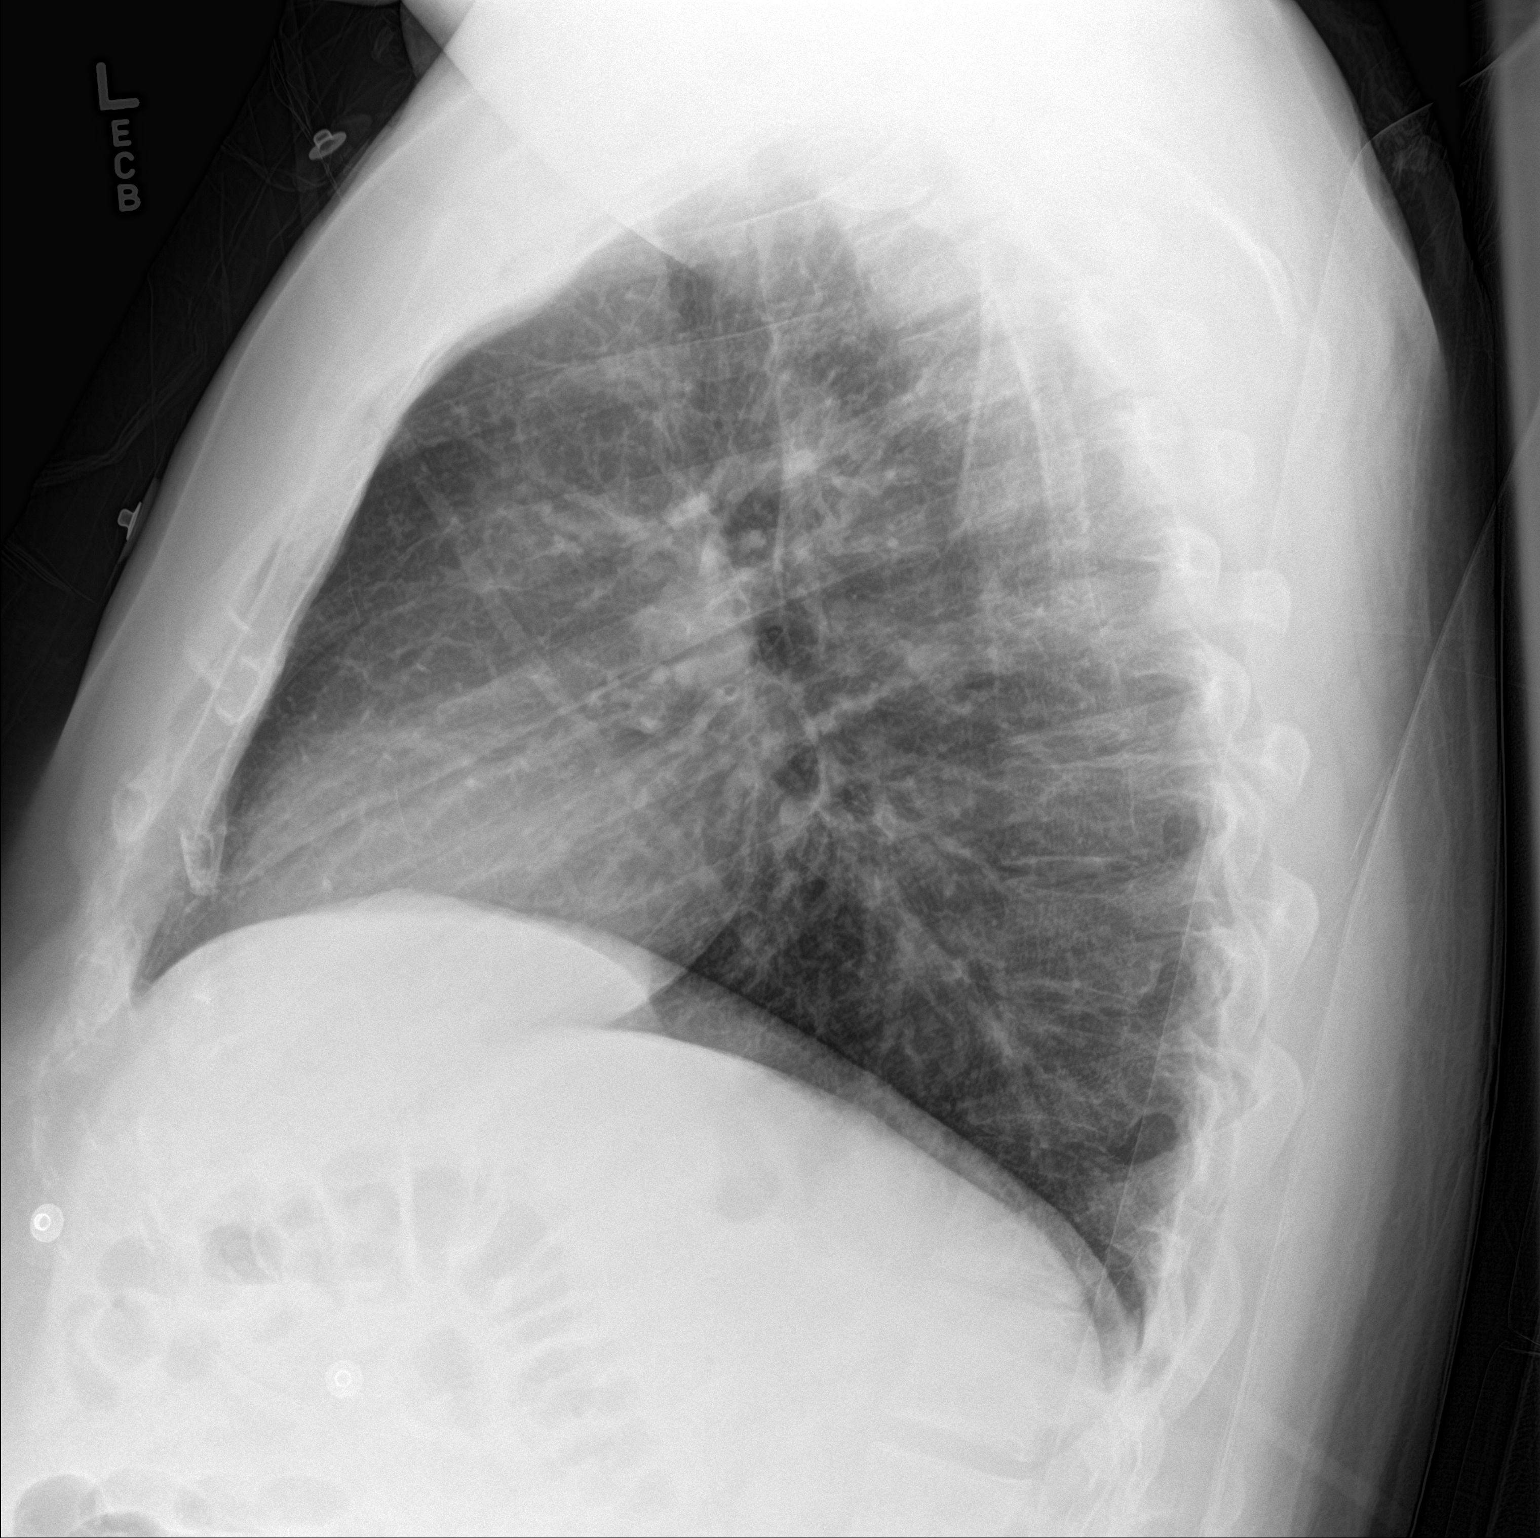

[chest ap]
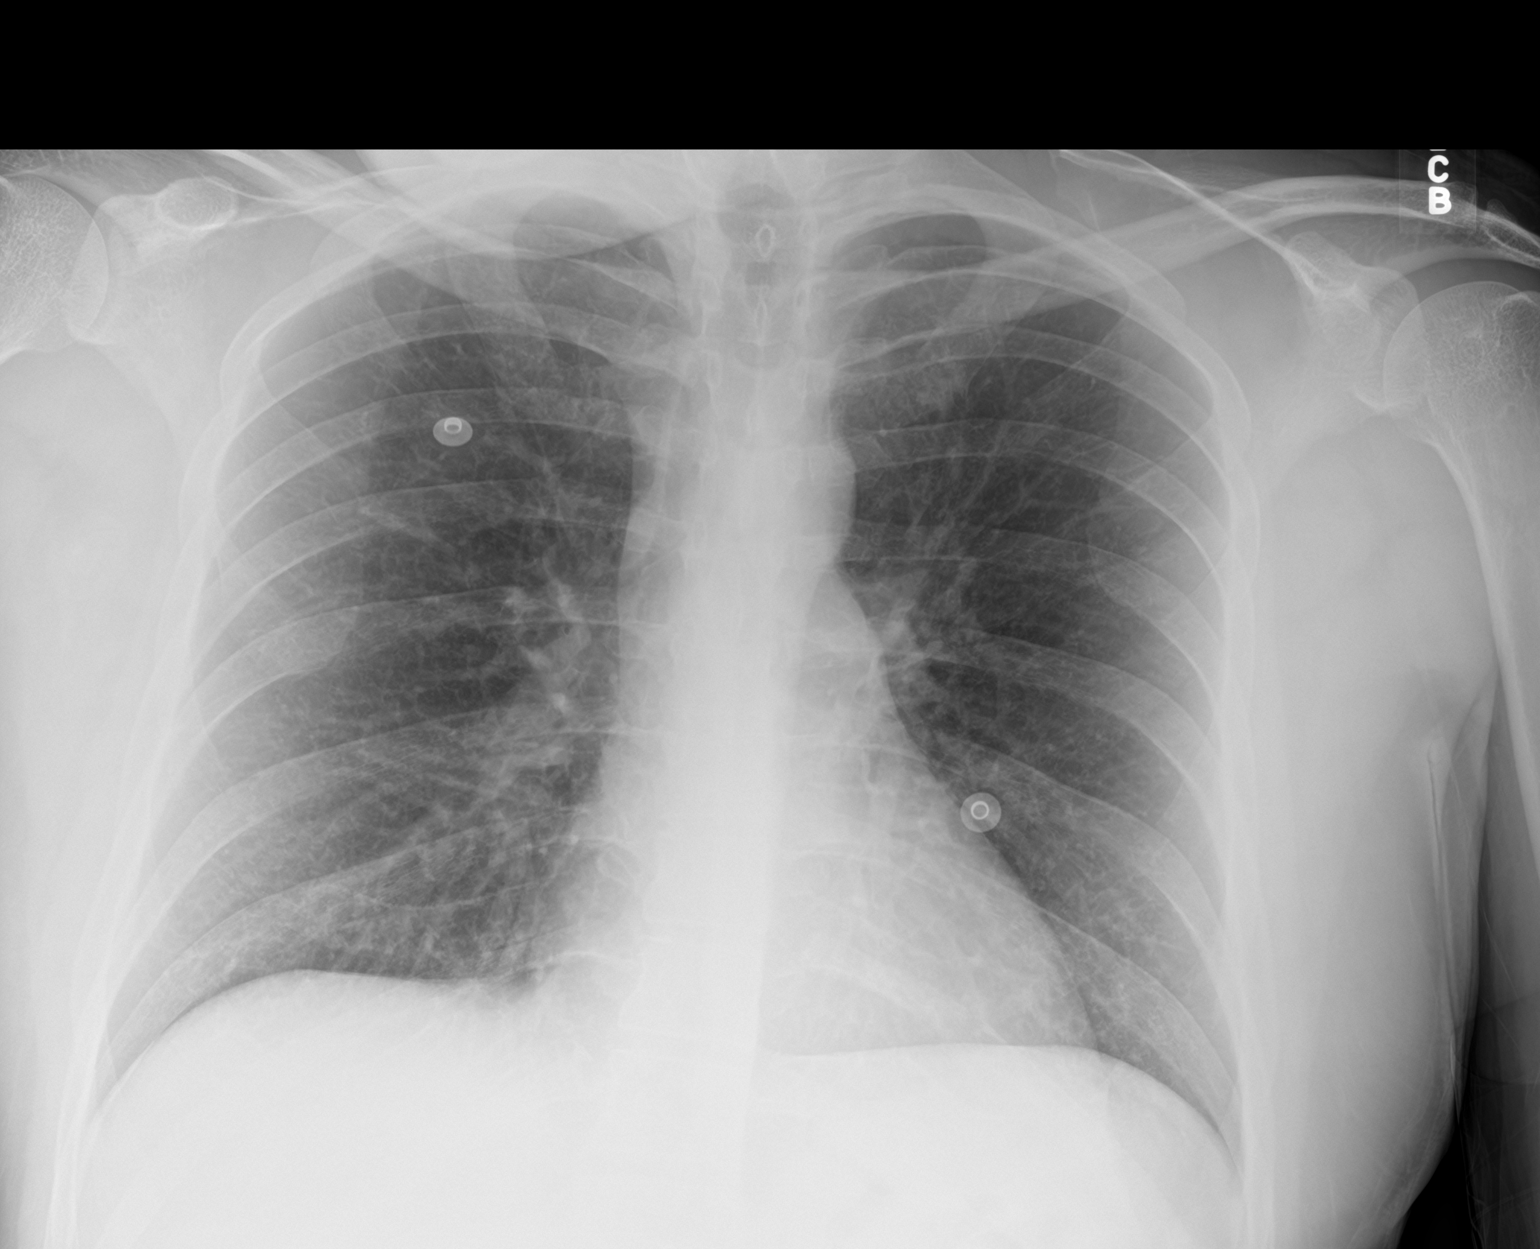

[2 of 2 positions shown; findings below may reference images not displayed]

FINDINGS: The lungs are adequately inflated. The interstitial markings are
coarse but stable. There is no alveolar infiltrate or pleural
effusion. The heart and pulmonary vascularity are normal. The
mediastinum is normal in width. The bony thorax exhibits no acute
abnormality.
IMPRESSION: Mild interstitial marking prominence consistent with reactive airway
disease. This is slightly less conspicuous than on the previous
study. There is no alveolar pneumonia, CHF, nor other acute
cardiopulmonary abnormality.

## 2017-07-09 ENCOUNTER — Encounter: Payer: Self-pay | Admitting: Physician Assistant

## 2017-07-09 ENCOUNTER — Other Ambulatory Visit: Payer: Self-pay

## 2017-07-09 ENCOUNTER — Ambulatory Visit (INDEPENDENT_AMBULATORY_CARE_PROVIDER_SITE_OTHER): Payer: Commercial Managed Care - PPO | Admitting: Physician Assistant

## 2017-07-09 VITALS — BP 132/84 | HR 88 | Temp 98.5°F | Resp 18 | Ht 72.44 in | Wt 241.0 lb

## 2017-07-09 DIAGNOSIS — J4540 Moderate persistent asthma, uncomplicated: Secondary | ICD-10-CM

## 2017-07-09 DIAGNOSIS — H60501 Unspecified acute noninfective otitis externa, right ear: Secondary | ICD-10-CM | POA: Diagnosis not present

## 2017-07-09 MED ORDER — ALBUTEROL SULFATE HFA 108 (90 BASE) MCG/ACT IN AERS: INHALATION_SPRAY | RESPIRATORY_TRACT | 1 refills | Status: DC

## 2017-07-09 MED ORDER — OFLOXACIN 0.3 % OT SOLN: 10.0000 [drp] | Freq: Every day | OTIC | 0 refills | Status: AC

## 2017-07-09 NOTE — Progress Notes (Addendum)
Brad GarbeZachary Wince  MRN: [id number] DOB: 06-22-1986  Subjective:  Brad Davidson is a 31 y.o. male seen in office today for a chief complaint of multiple complaints.  1) Medication refill: Needs refill for albuterol inhaler.  Has history of asthma.  Diagnosed since age 31.  Currently controlled on QVAR daily and albuterol as needed.  He admits to poor compliance with maintenance inhaler.  Notes that he will take QVAR daily for a while and then after his symptoms improve, he will quit the QVAR for a period of time.  This in turn causes his symptoms to worsen.  He iused albuterol 6-8 times a day.  He now uses it once daily.  He has not used his QVAR inhaler consistently over the past 4 weeks.  Notes his triggers are dust.  Today he is asymptomatic.  He denies chest tightness, shortness of breath, and wheezing.  2)He would also like to talk about right ear issue. Larey SeatFell off a 3 foot ladder while doing yard work 2.5 weeks ago. He guarded his head with his arm but did hit his ear against his arm. Had immediate ear pain, swelling, and tinitus. His sx resolved but he now has ear clogged sensation and more noticeable ear wax. Has associated severe ear itching.  Has had some fever. Denies LOC, hearing loss, chills, nausea, vomiting, dizziness, headache, confusion, and slurred speech. Has tried peroxide, ear drops, and cotton swabs with no relief.   Review of Systems Per HPI  Patient Active Problem List   Diagnosis Date Noted  . Asthma, moderate persistent 10/07/2015  . Deviated septum 10/07/2015  . Rhinitis, allergic 10/07/2015    Current Outpatient Medications on File Prior to Visit  Medication Sig Dispense Refill  . albuterol (PROVENTIL HFA;VENTOLIN HFA) 108 (90 Base) MCG/ACT inhaler TAKE 2 PUFFS BY MOUTH EVERY 6 HOURS AS NEEDED FOR WHEEZE OR SHORTNESS OF BREATH 8.5 Inhaler 0  . beclomethasone (QVAR) 80 MCG/ACT inhaler Inhale 2 puffs into the lungs 2 (two) times daily. 1 Inhaler 2  . loratadine  (CLARITIN) 10 MG tablet Take 1 tablet (10 mg total) by mouth daily. 30 tablet 11  . cetirizine (ZYRTEC) 10 MG tablet Take 1 tablet (10 mg total) by mouth daily. (Patient not taking: Reported on 07/09/2017) 30 tablet 5  . levocetirizine (XYZAL) 5 MG tablet Take 1 tablet (5 mg total) by mouth every evening. (Patient not taking: Reported on 05/08/2016) 30 tablet 11  . mometasone-formoterol (DULERA) 100-5 MCG/ACT AERO Inhale 2 puffs into the lungs 2 (two) times daily. (Patient not taking: Reported on 12/29/2016) 1 Inhaler 11   No current facility-administered medications on file prior to visit.     Allergies  Allergen Reactions  . Lactose Intolerance (Gi) Nausea Only  . Loratadine Nausea And Vomiting, Palpitations and Other (See Comments)    hyperactivity  . Morphine And Related Other (See Comments)    Chest pains, anxiety, burning sensations in vein  . Penicillins Hives  . Whey Nausea Only     Objective:  BP 132/84 (BP Location: Right Arm, Patient Position: Sitting, Cuff Size: Large)   Pulse 88   Temp 98.5 F (36.9 C) (Oral)   Resp 18   Ht 6' 0.44" (1.84 m)   Wt 241 lb (109.3 kg)   SpO2 97%   BMI 32.29 kg/m   Physical Exam  Constitutional: He is oriented to person, place, and time and well-developed, well-nourished, and in no distress.  HENT:  Head: Normocephalic. Head is without raccoon's eyes  and without Battle's sign.  Right Ear: There is drainage (moderate amount of flaky yellow debri noted in ear canal and overlying TM, no fungal spores noted), swelling (moderate swelling and erythema of ear canal) and tenderness (of ear canal with otoscope exam ). No foreign bodies. No mastoid tenderness. Tympanic membrane is not injected, not scarred, not erythematous and not bulging. No hemotympanum.  Left Ear: Tympanic membrane, external ear and ear canal normal.  Eyes: Conjunctivae are normal.  Neck: Normal range of motion.  Pulmonary/Chest: Effort normal. He has no wheezes. He has no  rhonchi. He has no rales.  Lymphadenopathy:       Head (right side): No submental, no submandibular, no tonsillar, no preauricular, no posterior auricular and no occipital adenopathy present.       Head (left side): No submental, no submandibular, no tonsillar, no preauricular, no posterior auricular and no occipital adenopathy present.    He has no cervical adenopathy.       Right: No supraclavicular adenopathy present.       Left: No supraclavicular adenopathy present.  Neurological: He is alert and oriented to person, place, and time. He has normal sensation and intact cranial nerves. He exhibits normal muscle tone. He has a normal Finger-Nose-Finger Test, a normal Heel to [employer] Test and a normal Romberg Test. Gait normal.  Reflex Scores:      Tricep reflexes are 2+ on the right side.      Bicep reflexes are 2+ on the right side and 2+ on the left side.      Brachioradialis reflexes are 2+ on the right side and 2+ on the left side.      Patellar reflexes are 2+ on the right side and 2+ on the left side.      Achilles reflexes are 2+ on the right side and 2+ on the left side. Skin: Skin is warm and dry.  Psychiatric: Affect normal.  Vitals reviewed.  Asthma control test score of 14.  Assessment and Plan :  1. Moderate persistent chronic asthma without complication Patient encouraged to use maintenance inhaler as prescribed daily for better control of asthma.  Educated that albuterol inhaler is supposed to be used as rescue inhaler.  Plan to return in 3-4 weeks for PFTs. - albuterol (PROVENTIL HFA;VENTOLIN HFA) 108 (90 Base) MCG/ACT inhaler; TAKE 2 PUFFS BY MOUTH EVERY 6 HOURS AS NEEDED FOR WHEEZE OR SHORTNESS OF BREATH  Dispense: 8 g; Refill: 1  2. Acute otitis externa of right ear, unspecified type Physical exam findings consistent with otitis externa.  No signs of trauma.  Given Rx for eardrops. Advised to return to clinic if symptoms worsen, do not improve, or as needed. - ofloxacin  (FLOXIN) 0.3 % OTIC solution; Place 10 drops into the right ear daily for 7 days.  Dispense: 5 mL; Refill: 0  Benjiman CoreBrittany Charyl Minervini PA-C  [facility]  [facility] 07/09/2017 2:59 PM

## 2017-07-09 NOTE — Patient Instructions (Addendum)
Your exam is consistent with a external ear infection.  I have given you an eardrops to use for the next week.  Please return to office if you do not get any improvement with the eardrops or your symptoms worsen.  To use the ear drops: -Lie down or tilt your head with your ear facing upward. Open the ear canal by gently pulling your ear back, or pulling downward on the earlobe when giving this medicine to a child. -Hold the dropper upside down over your ear and drop the correct number of drops into the ear. -Stay lying down or with your head tilted for at least 5 minutes. You may use a small piece of cotton to plug the ear and keep the medicine from draining out. -Do not touch the dropper tip or place it directly in your ear. It may become contaminated. Wipe the tip with a clean tissue but do not wash with water or soap. -Use this medicine for the full prescribed length of time. Your symptoms may improve before the infection is completely cleared. Skipping doses may also increase your risk of further infection that is resistant to antibiotics.   In terms of asthma, I recommend using Qvar as prescribed daily even when he feel like you are doing better.  Plan to follow-up in 3-4 weeks for pulmonary function testing.  Otitis Externa Otitis externa is an infection of the outer ear canal. The outer ear canal is the area between the outside of the ear and the eardrum. Otitis externa is sometimes called "swimmer's ear." Follow these instructions at home:  If you were given antibiotic ear drops, use them as told by your doctor. Do not stop using them even if your condition gets better.  Take over-the-counter and prescription medicines only as told by your doctor.  Keep all follow-up visits as told by your doctor. This is important. How is this prevented?  Keep your ear dry. Use the corner of a towel to dry your ear after you swim or bathe.  Try not to scratch or put things in your ear. Doing  these things makes it easier for germs to grow in your ear.  Avoid swimming in lakes, dirty water, or pools that may not have the right amount of a chemical called chlorine.  Consider making ear drops and putting 3 or 4 drops in each ear after you swim. Ask your doctor about how you can make ear drops. Contact a doctor if:  You have a fever.  After 3 days your ear is still red, swollen, or painful.  After 3 days you still have pus coming from your ear.  Your redness, swelling, or pain gets worse.  You have a really bad headache.  You have redness, swelling, pain, or tenderness behind your ear. This information is not intended to replace advice given to you by your health care provider. Make sure you discuss any questions you have with your health care provider. Document Released: 01/09/2008 Document Revised: 08/18/2015 Document Reviewed: 05/02/2015 Elsevier Interactive Patient Education  2018 [employer]  IF you received an x-ray today, you will receive an invoice from [facility]. Please contact [facility] at [phone] with questions or concerns regarding your invoice.   IF you received labwork today, you will receive an invoice from [facility]. Please contact [facility] at [phone] with questions or concerns regarding your invoice.   Our billing staff will not be able to assist you with questions regarding bills from these companies.  You will be contacted with the lab results as soon as they are available. The fastest way to get your results is to activate your My Chart account. Instructions are located on the last page of this paperwork. If you have not heard from [place] regarding the results in 2 weeks, please contact this office.

## 2017-07-11 ENCOUNTER — Other Ambulatory Visit: Payer: Self-pay | Admitting: Physician Assistant

## 2017-07-11 DIAGNOSIS — J4540 Moderate persistent asthma, uncomplicated: Secondary | ICD-10-CM

## 2017-07-11 NOTE — Telephone Encounter (Signed)
Patient was seen in 5-24 stated would reevaluate in 6 weeks.   He had an appointment on 12-4 do not see any mention.   Can we refill

## 2017-07-11 NOTE — Telephone Encounter (Signed)
I refilled his in office on 07/09/17.  Thanks!

## 2017-07-25 MED ORDER — BECLOMETHASONE DIPROPIONATE 80 MCG/ACT IN AERS: 2.0000 | INHALATION_SPRAY | Freq: Two times a day (BID) | RESPIRATORY_TRACT | 2 refills | Status: DC

## 2017-07-25 NOTE — Addendum Note (Signed)
Addended by: Benjiman CoreWISEMAN, Nichole Neyer D on: 07/25/2017 08:02 PM   Modules accepted: Orders

## 2017-08-07 ENCOUNTER — Ambulatory Visit (INDEPENDENT_AMBULATORY_CARE_PROVIDER_SITE_OTHER): Payer: Commercial Managed Care - PPO | Admitting: Physician Assistant

## 2017-08-07 ENCOUNTER — Ambulatory Visit (INDEPENDENT_AMBULATORY_CARE_PROVIDER_SITE_OTHER): Payer: Commercial Managed Care - PPO

## 2017-08-07 ENCOUNTER — Encounter: Payer: Self-pay | Admitting: Physician Assistant

## 2017-08-07 ENCOUNTER — Other Ambulatory Visit: Payer: Self-pay

## 2017-08-07 VITALS — BP 118/74 | HR 90 | Temp 97.8°F | Resp 16 | Ht 73.03 in | Wt 241.4 lb

## 2017-08-07 DIAGNOSIS — J4540 Moderate persistent asthma, uncomplicated: Secondary | ICD-10-CM

## 2017-08-07 DIAGNOSIS — Z8719 Personal history of other diseases of the digestive system: Secondary | ICD-10-CM | POA: Diagnosis not present

## 2017-08-07 DIAGNOSIS — R109 Unspecified abdominal pain: Secondary | ICD-10-CM | POA: Diagnosis not present

## 2017-08-07 DIAGNOSIS — R195 Other fecal abnormalities: Secondary | ICD-10-CM

## 2017-08-07 DIAGNOSIS — K5900 Constipation, unspecified: Secondary | ICD-10-CM

## 2017-08-07 LAB — CMP14+EGFR
ALBUMIN: 4.3 g/dL (ref 3.5–5.5)
ALK PHOS: 104 IU/L (ref 39–117)
ALT: 43 IU/L (ref 0–44)
AST: 22 IU/L (ref 0–40)
Albumin/Globulin Ratio: 1.4 (ref 1.2–2.2)
BUN / CREAT RATIO: 13 (ref 9–20)
BUN: 12 mg/dL (ref 6–20)
Bilirubin Total: 0.3 mg/dL (ref 0.0–1.2)
CO2: 19 mmol/L — AB (ref 20–29)
CREATININE: 0.96 mg/dL (ref 0.76–1.27)
Calcium: 9.1 mg/dL (ref 8.7–10.2)
Chloride: 104 mmol/L (ref 96–106)
GFR calc non Af Amer: 106 mL/min/{1.73_m2} (ref >59)
GFR, EST AFRICAN AMERICAN: 122 mL/min/{1.73_m2} (ref >59)
GLUCOSE: 96 mg/dL (ref 65–99)
Globulin, Total: 3 g/dL (ref 1.5–4.5)
Potassium: 4.3 mmol/L (ref 3.5–5.2)
Sodium: 139 mmol/L (ref 134–144)
TOTAL PROTEIN: 7.3 g/dL (ref 6.0–8.5)

## 2017-08-07 LAB — CBC WITH DIFFERENTIAL/PLATELET
BASOS ABS: 0 10*3/uL (ref 0.0–0.2)
Basos: 0 %
EOS (ABSOLUTE): 0.4 10*3/uL (ref 0.0–0.4)
Eos: 5 %
HEMOGLOBIN: 15.6 g/dL (ref 13.0–17.7)
Hematocrit: 46.2 % (ref 37.5–51.0)
IMMATURE GRANS (ABS): 0 10*3/uL (ref 0.0–0.1)
Immature Granulocytes: 0 %
LYMPHS: 37 %
Lymphocytes Absolute: 2.7 10*3/uL (ref 0.7–3.1)
MCH: 29.3 pg (ref 26.6–33.0)
MCHC: 33.8 g/dL (ref 31.5–35.7)
MCV: 87 fL (ref 79–97)
MONOCYTES: 6 %
Monocytes Absolute: 0.5 10*3/uL (ref 0.1–0.9)
Neutrophils Absolute: 3.8 10*3/uL (ref 1.4–7.0)
Neutrophils: 52 %
Platelets: 283 10*3/uL (ref 150–379)
RBC: 5.32 x10E6/uL (ref 4.14–5.80)
RDW: 13.6 % (ref 12.3–15.4)
WBC: 7.3 10*3/uL (ref 3.4–10.8)

## 2017-08-07 MED ORDER — IPRATROPIUM BROMIDE 0.02 % IN SOLN
0.5000 mg | Freq: Once | RESPIRATORY_TRACT | Status: AC
  Administered 2017-08-07: 0.5 mg via RESPIRATORY_TRACT

## 2017-08-07 MED ORDER — QVAR REDIHALER 80 MCG/ACT IN AERB: 2.0000 | INHALATION_SPRAY | Freq: Two times a day (BID) | RESPIRATORY_TRACT | 2 refills | Status: DC

## 2017-08-07 MED ORDER — ALBUTEROL SULFATE (2.5 MG/3ML) 0.083% IN NEBU
2.5000 mg | INHALATION_SOLUTION | Freq: Once | RESPIRATORY_TRACT | Status: AC
  Administered 2017-08-07: 2.5 mg via RESPIRATORY_TRACT

## 2017-08-07 NOTE — Progress Notes (Signed)
Brad Davidson  MRN: [id number] DOB: [birth year]  Subjective:  Brad Davidson is a 32 y.o. male seen in office today for a chief complaint of mutliple complaints.   1) Was around dust and animals a lot during the holidays and had multiple set backs in asthma. He is currently doing QVAR once daily and albuterol prn. He is supposed to take QVAR twice daily but forgets to take it in the morning. Typical uses albuterol once daily but now is using it a few times daily. Notes he will typicaly experience symptoms at least a few times a day. Symptoms interfere with his ability to do activity. Feeling better now. Still having some SOB, wheezing, and chest tightness occassionally, not present today. Denies chest pain.Still doing claritin daily. The albuterol inhaer really helps. Has not seen asthma specialist since he was a child.   2) He also thinks he has a GI parasite. Notes for the past few months he has had lots of mucopurluent stools. Will occassionally notice bright red blood. Has intermittent suprapubic abdominal pain when he is constipated. Not he will vomit 1-2 times per week depending on what food he eats. Typically has 2-3 BMs a day. Now he is only having one. Denies current nausea, vomiting, fever, and chills. Denies recent travel. He does eat lots of sushi. He has struggled with mix of constipation and diarrhea in the past. Has tried miralax in the past. He has hx of anal prolapse, GERD, and lactose intolerance. Denies smoking. Does not drink alcohol anymore. He has been seen by Dr. Benson Norway, GI specialist, in the past. Told he had anal prolapse and GERD. Was treated with antacid. Still takes them occassionally. Did not have an endoscopy. He never followed up with them last saw them a few years ago.   Review of Systems  Genitourinary: Negative for decreased urine volume, discharge, dysuria, frequency, hematuria, penile pain, scrotal swelling, testicular pain and urgency.   Patient Active Problem List   Diagnosis Date Noted  . Asthma, moderate persistent 10/07/2015  . Deviated septum 10/07/2015  . Rhinitis, allergic 10/07/2015    Current Outpatient Medications on File Prior to Visit  Medication Sig Dispense Refill  . albuterol (PROVENTIL HFA;VENTOLIN HFA) 108 (90 Base) MCG/ACT inhaler TAKE 2 PUFFS BY MOUTH EVERY 6 HOURS AS NEEDED FOR WHEEZE OR SHORTNESS OF BREATH 8 g 1  . loratadine (CLARITIN) 10 MG tablet Take 1 tablet (10 mg total) by mouth daily. 30 tablet 11  . QVAR REDIHALER 80 MCG/ACT inhaler Inhale 2 puffs into the lungs 2 (two) times daily.      No current facility-administered medications on file prior to visit.     Allergies  Allergen Reactions  . Lactose Intolerance (Gi) Nausea Only  . Loratadine Nausea And Vomiting, Palpitations and Other (See Comments)    hyperactivity  . Morphine And Related Other (See Comments)    Chest pains, anxiety, burning sensations in vein  . Penicillins Hives  . Whey Nausea Only       Objective:  BP 118/74 (BP Location: Left Arm, Patient Position: Sitting, Cuff Size: Large)   Pulse 90   Temp 97.8 F (36.6 C) (Oral)   Resp 16   Ht 6' 1.03" (1.855 m)   Wt 241 lb 6.4 oz (109.5 kg)   SpO2 99%   BMI 31.82 kg/m   Physical Exam  Constitutional: He is oriented to person, place, and time and well-developed, well-nourished, and in no distress.  HENT:  Head: Normocephalic and atraumatic.  Right Ear: Tympanic membrane is not erythematous and not bulging. A middle ear effusion is present.  Left Ear: Tympanic membrane is not erythematous and not bulging. A middle ear effusion is present.  Nose: Mucosal edema (moderate bilaterally) present.  Mouth/Throat: Uvula is midline, oropharynx is clear and moist and mucous membranes are normal.  Eyes: Conjunctivae are normal.  Neck: Normal range of motion.  Cardiovascular: Normal rate, regular rhythm and normal heart sounds.  Pulmonary/Chest: Effort normal. No accessory muscle usage. No respiratory  distress. He has no decreased breath sounds. He has no wheezes. He has no rhonchi. He has no rales.  Abdominal: Soft. Normal appearance and bowel sounds are normal. He exhibits no distension. There is tenderness (mild) in the suprapubic area. There is no guarding, no CVA tenderness, no tenderness at McBurney's point and negative Murphy's sign.  Genitourinary:  Genitourinary Comments: Deferred rectal exam  Lymphadenopathy:       Head (right side): No submental, no submandibular, no tonsillar, no preauricular, no posterior auricular and no occipital adenopathy present.       Head (left side): No submental, no submandibular, no tonsillar, no preauricular, no posterior auricular and no occipital adenopathy present.    He has no cervical adenopathy.       Right: No supraclavicular adenopathy present.       Left: No supraclavicular adenopathy present.  Neurological: He is alert and oriented to person, place, and time. Gait normal.  Skin: Skin is warm and dry.  Psychiatric: Affect normal.  Vitals reviewed.   Office Spirometry Results: FEV1: 3.84 liters FVC: 5.28 liters FEV1/FVC: 72.7 % FVC  % Predicted: 72.7 % FEV % Predicted: 89 % FeF 25-75: 2.57 liters FeF 25-75 % Predicted: 54  Assessment and Plan :  1. Moderate persistent asthma without complication Lungs are CTAB. Pt sx's place him in the moderate to severe category of asthma.  His FEV1 and  FEV1/FVC > 70%, which is reassuring. Due to frequency of symptoms, suggest patient use QVAR twice daily as prescribed. Use rescue inhaler only as needed. If no improvement in frequency of symptoms despite this increase, will refer to asthma specialist for further evaluation.  - Care order/instruction: - albuterol (PROVENTIL) (2.5 MG/3ML) 0.083% nebulizer solution 2.5 mg - ipratropium (ATROVENT) nebulizer solution 0.5 mg - QVAR REDIHALER 80 MCG/ACT inhaler; Inhale 2 puffs into the lungs 2 (two) times daily.  Dispense: 10.6 g; Refill: 2 2.  Constipation, unspecified constipation type - GI Profile, Stool, PCR - Ova and parasite examination - DG Abd 1 View; Future - Ambulatory referral to Gastroenterology 3. Abdominal pressure Pt is has mild suprapubic tenderness noted on exam, suspect constipation. Plain films pending. He is overall well appearing, in no distress. Vitals stable.  - DG Abd 1 View; Future - Ambulatory referral to Gastroenterology 4. H/O gastroesophageal reflux (GERD) - Ambulatory referral to Gastroenterology 5. Mucous in stools Pt DECLINES rectal exam. Labs and plain film of abdomen pending. Due to extensive hx of abdominal issues and no recent follow up with GI, recommend he reestablish with GI. Referral placed.  - CBC with Differential/Platelet - CMP14+EGFR - GI Profile, Stool, PCR - Ova and parasite examination  Tenna Delaine PA-C  [facility] at [facility] 08/07/2017 11:18 AM

## 2017-08-07 NOTE — Patient Instructions (Addendum)
  For better asthma control, I recommend increasing Qvar to 2 puffs twice daily. Use albuterol only as needed.  For abdominal issues, I have ordered some labs and stool cultures to look for any bacteria and parasites.  Please return those as directed.  Please go to the [facility] [address] and have your abdominal x-ray.  We will contact you with these results and discuss further treatment plans.  I have also placed a referral for GI.  I think it is a good idea for you to establish with them for better control of your extensive abdominal issues.  They should contact you within 1-2 weeks to schedule an appointment. If you have not heard from them by then, please contact our office.   Thank you for letting me participate in your health and well being.    IF you received an x-ray today, you will receive an invoice from [facility]. Please contact [facility] at [phone] with questions or concerns regarding your invoice.   IF you received labwork today, you will receive an invoice from [facility]. Please contact [facility] at [phone] with questions or concerns regarding your invoice.   Our billing staff will not be able to assist you with questions regarding bills from these companies.  You will be contacted with the lab results as soon as they are available. The fastest way to get your results is to activate your My Chart account. Instructions are located on the last page of this paperwork. If you have not heard from us regarding the results in 2 weeks, please contact this office.

## 2017-08-09 LAB — GI PROFILE, STOOL, PCR
ASTROVIRUS: NOT DETECTED
Adenovirus F 40/41: NOT DETECTED
C difficile toxin A/B: NOT DETECTED
CRYPTOSPORIDIUM: NOT DETECTED
CYCLOSPORA CAYETANENSIS: NOT DETECTED
Campylobacter: NOT DETECTED
ENTEROPATHOGENIC E COLI: NOT DETECTED
Entamoeba histolytica: NOT DETECTED
Enteroaggregative E coli: NOT DETECTED
Enterotoxigenic E coli: NOT DETECTED
Giardia lamblia: NOT DETECTED
Norovirus GI/GII: NOT DETECTED
Plesiomonas shigelloides: NOT DETECTED
ROTAVIRUS A: NOT DETECTED
SALMONELLA: NOT DETECTED
SAPOVIRUS: NOT DETECTED
SHIGA-TOXIN-PRODUCING E COLI: NOT DETECTED
Shigella/Enteroinvasive E coli: NOT DETECTED
VIBRIO: NOT DETECTED
Vibrio cholerae: NOT DETECTED
Yersinia enterocolitica: NOT DETECTED

## 2017-08-10 ENCOUNTER — Telehealth: Payer: Self-pay | Admitting: Radiology

## 2017-08-10 NOTE — Telephone Encounter (Signed)
Called, spoke with patient, informed of results. The patient stated that he feels okay, but has some stress , having a baby. Advised to return to clinic as needed.

## 2017-08-13 LAB — OVA AND PARASITE EXAMINATION

## 2017-08-16 ENCOUNTER — Other Ambulatory Visit: Payer: Self-pay

## 2017-08-16 ENCOUNTER — Emergency Department (HOSPITAL_COMMUNITY)
Admission: EM | Admit: 2017-08-16 | Discharge: 2017-08-16 | Disposition: A | Discharge status: Home / Self Care | Payer: Commercial Managed Care - PPO | Attending: Emergency Medicine | Admitting: Emergency Medicine

## 2017-08-16 ENCOUNTER — Encounter (HOSPITAL_COMMUNITY): Payer: Self-pay | Admitting: *Deleted

## 2017-08-16 DIAGNOSIS — Z5321 Procedure and treatment not carried out due to patient leaving prior to being seen by health care provider: Secondary | ICD-10-CM | POA: Insufficient documentation

## 2017-08-16 DIAGNOSIS — R079 Chest pain, unspecified: Secondary | ICD-10-CM | POA: Diagnosis not present

## 2017-08-16 DIAGNOSIS — R109 Unspecified abdominal pain: Secondary | ICD-10-CM | POA: Diagnosis not present

## 2017-08-16 LAB — COMPREHENSIVE METABOLIC PANEL
ALK PHOS: 81 U/L (ref 38–126)
ALT: 30 U/L (ref 17–63)
AST: 28 U/L (ref 15–41)
Albumin: 3.9 g/dL (ref 3.5–5.0)
Anion gap: 10 (ref 5–15)
BUN: 15 mg/dL (ref 6–20)
CALCIUM: 9.1 mg/dL (ref 8.9–10.3)
CO2: 19 mmol/L — ABNORMAL LOW (ref 22–32)
CREATININE: 0.98 mg/dL (ref 0.61–1.24)
Chloride: 107 mmol/L (ref 101–111)
Glucose, Bld: 122 mg/dL — ABNORMAL HIGH (ref 65–99)
Potassium: 3.5 mmol/L (ref 3.5–5.1)
Sodium: 136 mmol/L (ref 135–145)
Total Bilirubin: 0.4 mg/dL (ref 0.3–1.2)
Total Protein: 7.1 g/dL (ref 6.5–8.1)

## 2017-08-16 LAB — URINALYSIS, ROUTINE W REFLEX MICROSCOPIC
Bilirubin Urine: NEGATIVE
GLUCOSE, UA: NEGATIVE mg/dL
HGB URINE DIPSTICK: NEGATIVE
Ketones, ur: NEGATIVE mg/dL
Leukocytes, UA: NEGATIVE
Nitrite: NEGATIVE
PH: 6 (ref 5.0–8.0)
Protein, ur: NEGATIVE mg/dL
SPECIFIC GRAVITY, URINE: 1.025 (ref 1.005–1.030)

## 2017-08-16 LAB — CBC
HEMATOCRIT: 42.9 % (ref 39.0–52.0)
HEMOGLOBIN: 14.6 g/dL (ref 13.0–17.0)
MCH: 28.7 pg (ref 26.0–34.0)
MCHC: 34 g/dL (ref 30.0–36.0)
MCV: 84.4 fL (ref 78.0–100.0)
Platelets: 270 10*3/uL (ref 150–400)
RBC: 5.08 MIL/uL (ref 4.22–5.81)
RDW: 12.6 % (ref 11.5–15.5)
WBC: 16.8 10*3/uL — ABNORMAL HIGH (ref 4.0–10.5)

## 2017-08-16 LAB — LIPASE, BLOOD: LIPASE: 26 U/L (ref 11–51)

## 2017-08-16 NOTE — ED Triage Notes (Signed)
Pt c/o epigastric, L chest and L flank pain with sudden onset at 0050. Pt reports increased belching, nausea. Pain is intermittent with episodes of severe intensity Family hx of ulcers in the past. 100mcg Fentanyl and 4mg  zofran given enroute by EMS

## 2017-08-16 NOTE — ED Notes (Signed)
Pt stated he couldn't wait any longer.

## 2017-08-29 ENCOUNTER — Other Ambulatory Visit: Payer: Self-pay | Admitting: Physician Assistant

## 2017-08-29 DIAGNOSIS — J4540 Moderate persistent asthma, uncomplicated: Secondary | ICD-10-CM

## 2017-10-22 ENCOUNTER — Other Ambulatory Visit: Payer: Self-pay | Admitting: Physician Assistant

## 2017-10-22 DIAGNOSIS — J4540 Moderate persistent asthma, uncomplicated: Secondary | ICD-10-CM

## 2017-10-22 NOTE — Telephone Encounter (Signed)
Albuterol (Proair) inhaler refill Last OV: 08/07/17 Last Refill:08/29/17 Pharmacy:[facility] [address] PCP: Benjiman CoreBrittany Wiseman PA

## 2017-10-23 ENCOUNTER — Telehealth: Payer: Self-pay | Admitting: Physician Assistant

## 2017-10-23 NOTE — Telephone Encounter (Signed)
Called pt and rescheduled his appt with Benjiman CoreBrittany Wiseman to 4/6 at 8:00 am . Advised pt of building number and time requests and late policy.

## 2017-11-04 ENCOUNTER — Encounter: Payer: Self-pay | Admitting: Physician Assistant

## 2017-11-06 ENCOUNTER — Ambulatory Visit: Payer: Commercial Managed Care - PPO | Admitting: Physician Assistant

## 2017-11-09 ENCOUNTER — Ambulatory Visit: Payer: Commercial Managed Care - PPO | Admitting: Physician Assistant

## 2017-12-23 ENCOUNTER — Telehealth: Payer: Self-pay | Admitting: Physician Assistant

## 2017-12-23 NOTE — Telephone Encounter (Signed)
Copied from CRM [id number]. Topic: Quick Communication - Rx Refill/Question >> Dec 23, 2017  1:56 PM Leafy Ro wrote: Medication: albuterol inhaler Has the patient contacted their pharmacy? Yes  (Agent: If yes, when and what did the pharmacy advise?)  Preferred Pharm[facility] (with phone number [facility] [address]. Pt has made an appt for 01-01-18

## 2017-12-24 ENCOUNTER — Other Ambulatory Visit: Payer: Self-pay

## 2017-12-24 DIAGNOSIS — J4540 Moderate persistent asthma, uncomplicated: Secondary | ICD-10-CM

## 2017-12-24 MED ORDER — ALBUTEROL SULFATE HFA 108 (90 BASE) MCG/ACT IN AERS: INHALATION_SPRAY | RESPIRATORY_TRACT | 1 refills | Status: DC

## 2017-12-24 NOTE — Telephone Encounter (Signed)
Rx has been filled 

## 2018-01-01 ENCOUNTER — Ambulatory Visit (INDEPENDENT_AMBULATORY_CARE_PROVIDER_SITE_OTHER): Payer: Commercial Managed Care - PPO | Admitting: Physician Assistant

## 2018-01-01 ENCOUNTER — Encounter: Payer: Self-pay | Admitting: Physician Assistant

## 2018-01-01 ENCOUNTER — Other Ambulatory Visit: Payer: Self-pay

## 2018-01-01 VITALS — BP 112/74 | HR 94 | Temp 98.2°F | Resp 18 | Ht 71.38 in | Wt 234.0 lb

## 2018-01-01 DIAGNOSIS — J4540 Moderate persistent asthma, uncomplicated: Secondary | ICD-10-CM | POA: Diagnosis not present

## 2018-01-01 DIAGNOSIS — Z9119 Patient's noncompliance with other medical treatment and regimen: Secondary | ICD-10-CM

## 2018-01-01 DIAGNOSIS — J309 Allergic rhinitis, unspecified: Secondary | ICD-10-CM

## 2018-01-01 DIAGNOSIS — K219 Gastro-esophageal reflux disease without esophagitis: Secondary | ICD-10-CM

## 2018-01-01 DIAGNOSIS — Z91199 Patient's noncompliance with other medical treatment and regimen due to unspecified reason: Secondary | ICD-10-CM

## 2018-01-01 MED ORDER — BECLOMETHASONE DIPROP HFA 80 MCG/ACT IN AERB: 1.0000 | INHALATION_SPRAY | Freq: Two times a day (BID) | RESPIRATORY_TRACT | 1 refills | Status: AC

## 2018-01-01 MED ORDER — TRIAMCINOLONE ACETONIDE 55 MCG/ACT NA AERO: 2.0000 | INHALATION_SPRAY | Freq: Every day | NASAL | 0 refills | Status: AC

## 2018-01-01 MED ORDER — MONTELUKAST SODIUM 10 MG PO TABS: 10.0000 mg | ORAL_TABLET | Freq: Every day | ORAL | 0 refills | Status: AC

## 2018-01-01 MED ORDER — ALBUTEROL SULFATE HFA 108 (90 BASE) MCG/ACT IN AERS: INHALATION_SPRAY | RESPIRATORY_TRACT | 1 refills | Status: DC

## 2018-01-01 MED ORDER — RANITIDINE HCL 75 MG PO TABS: 75.0000 mg | ORAL_TABLET | Freq: Two times a day (BID) | ORAL | 1 refills | Status: DC | PRN

## 2018-01-01 NOTE — Progress Notes (Signed)
Terius Jacuinde  MRN: [id number] DOB: 09/17/1985  Subjective:  Brad Davidson is a 32 y.o. male seen in office today for a chief complaint of follow-up on asthma.  Patient was taking Qvar twice daily and albuterol as needed.  However, he ran out of both medications a couple months ago and did not schedule f/u appointment.   Since then, he went to an urgent care and got a refill for albuterol.   He has been using albuterol inhaler 4-5 times per day.  Notes his symptoms are worsened by going outside, exposures to chemicals and dust at work. Will occasionally have shortness of breath, wheezing, and chest tightness.  His symptoms interfere with activities.  He does get relief with albuterol.  In terms of allergies, he is taking loratadine daily.  Not using nasal spray.  In terms of GERD, he is not taking anything daily but does report intermittent acid reflux with certain foods, will occasionally try albuterol inhaler for this sensation and it will help a little.  Denies dysphagia, odynophagia, weight loss, melena, hematochezia,, hematemesis, abdominal pain, nausea, and vomiting.  Review of Systems  Per HPI  Patient Active Problem List   Diagnosis Date Noted  . Asthma, moderate persistent 10/07/2015  . Deviated septum 10/07/2015  . Rhinitis, allergic 10/07/2015    Current Outpatient Medications on File Prior to Visit  Medication Sig Dispense Refill  . albuterol (PROVENTIL HFA;VENTOLIN HFA) 108 (90 Base) MCG/ACT inhaler Take 2 puffs by mouth every 6 hours as needed for wheezing or shortness of breath. 8.5 Inhaler 1  . loratadine (CLARITIN) 10 MG tablet Take 1 tablet (10 mg total) by mouth daily. 30 tablet 11  . QVAR REDIHALER 80 MCG/ACT inhaler Inhale 2 puffs into the lungs 2 (two) times daily. (Patient not taking: Reported on 01/01/2018) 10.6 g 2   No current facility-administered medications on file prior to visit.     Allergies  Allergen Reactions  . Lactose Intolerance (Gi) Nausea Only  .  Loratadine Nausea And Vomiting, Palpitations and Other (See Comments)    hyperactivity  . Morphine And Related Other (See Comments)    Chest pains, anxiety, burning sensations in vein  . Penicillins Hives  . Whey Nausea Only      Social History   Socioeconomic History  . Marital status: Married    Spouse name: Pattie  . Number of children: 1  . Years of education: Not on file  . Highest education level: Not on file  Occupational History  . Not on file  Social Needs  . Financial resource strain: Not hard at all  . Food insecurity:    Worry: Never true    Inability: Never true  . Transportation needs:    Medical: No    Non-medical: No  Tobacco Use  . Smoking status: Never Smoker  . Smokeless tobacco: Never Used  Substance and Sexual Activity  . Alcohol use: No    Frequency: Never    Comment: quit x 1 yr ago  . Drug use: No  . Sexual activity: Yes  Lifestyle  . Physical activity:    Days per week: 5 days    Minutes per session: 30 min  . Stress: Only a little  Relationships  . Social connections:    Talks on phone: More than three times a week    Gets together: Twice a week    Attends religious service: More than 4 times per year    Active member of club or organization:  Yes    Attends meetings of clubs or organizations: More than 4 times per year    Relationship status: Married  . Intimate partner violence:    Fear of current or ex partner: No    Emotionally abused: No    Physically abused: No    Forced sexual activity: No  Other Topics Concern  . Not on file  Social History Narrative  . Not on file    Objective:  BP 112/74 (BP Location: Right Arm, Patient Position: Sitting, Cuff Size: Large)   Pulse 94   Temp 98.2 F (36.8 C) (Oral)   Resp 18   Ht 5' 11.38" (1.813 m)   Wt 234 lb (106.1 kg)   SpO2 95%   BMI 32.29 kg/m   Physical Exam  Constitutional: He is oriented to person, place, and time. He appears well-developed and well-nourished.  HENT:    Head: Normocephalic and atraumatic.  Right Ear: External ear and ear canal normal. Tympanic membrane is not erythematous and not bulging. A middle ear effusion is present.  Left Ear: External ear and ear canal normal. Tympanic membrane is not erythematous and not bulging. A middle ear effusion is present.  Nose: Mucosal edema, rhinorrhea and septal deviation present. Right sinus exhibits no maxillary sinus tenderness and no frontal sinus tenderness. Left sinus exhibits no maxillary sinus tenderness and no frontal sinus tenderness.  Mouth/Throat: Uvula is midline and mucous membranes are normal. Posterior oropharyngeal erythema present. Tonsils are 1+ on the right. Tonsils are 1+ on the left. No tonsillar exudate.  Eyes: Conjunctivae are normal. Left eye discharge:    Neck: Normal range of motion.  Cardiovascular: Normal rate, regular rhythm and normal heart sounds.  Pulmonary/Chest: Effort normal and breath sounds normal. He has no decreased breath sounds. He has no wheezes. He has no rhonchi. He has no rales.  Neurological: He is alert and oriented to person, place, and time.  Skin: Skin is warm and dry.  Psychiatric: He has a normal mood and affect.  Vitals reviewed.  ACT score of 17  Office Spirometry Results: FEV1: 2.72 liters FVC: 4.1 liters FEV1/FVC: 66.3 % FVC  % Predicted: 73 % FEV % Predicted: 60 % FeF 25-75: 1.65 liters FeF 25-75 % Predicted: 37  Assessment and Plan :  1. Moderate persistent chronic asthma without complication His asthma is uncontrolled at this time.  ACT score of 17.  FEV1 60% Patient is medically noncompliant.  Lungs CTAB.  Recommend continuous use of maintenance inhaler along with albuterol as needed.  Will start Singulair due to allergies.  Continue loratadine and start nasacort.  Use zantac for acid reflux, avoid use of albuterol inhaler for this sx.  Follow-up in 4 weeks for reevaluation.  Advised to return to clinic if symptoms worsen, do not improve, or  as needed.  - Care order/instruction - montelukast (SINGULAIR) 10 MG tablet; Take 1 tablet (10 mg total) by mouth at bedtime.  Dispense: 90 tablet; Refill: 0 - albuterol (PROVENTIL HFA;VENTOLIN HFA) 108 (90 Base) MCG/ACT inhaler; Take 2 puffs by mouth every 6 hours as needed for wheezing or shortness of breath.  Dispense: 8.5 Inhaler; Refill: 1 - beclomethasone (QVAR REDIHALER) 80 MCG/ACT inhaler; Inhale 1 puff into the lungs 2 (two) times daily.  Dispense: 10.6 g; Refill: 1  2. Allergic rhinitis - triamcinolone (NASACORT) 55 MCG/ACT AERO nasal inhaler; Place 2 sprays into the nose daily.  Dispense: 3 Inhaler; Refill: 0  3. Gastroesophageal reflux disease, esophagitis presence not specified -  ranitidine (ZANTAC 75) 75 MG tablet; Take 1 tablet (75 mg total) by mouth 2 (two) times daily as needed for heartburn.  Dispense: 30 tablet; Refill: 1  4. Medically noncompliant Had lengthy discussion with patient about the importance of taking medications for asthma as prescribed and proper f/u. He agrees to f/u this time before running out of medication.   Side effects, risks, benefits, and alternatives of the medications and treatment plan prescribed today were discussed, and patient expressed understanding of the instructions given. No barriers to understanding were identified. Red flags discussed in detail. Pt expressed understanding regarding what to do in case of emergency/urgent symptoms.    Benjiman Core PA-C  Primary Care at [facility] 01/01/2018 10:59 AM

## 2018-01-01 NOTE — Patient Instructions (Addendum)
For asthma control, start qvar 1 puff twice daily. Start singulair daily.Use albuterol as needed. For allergies, continue daily allergy medication and add daily nasacort (if nasacort starts to cause issues, decrease use by half) For acid reflux, use zantac 30 minutes before eating a food that is going to cause symptoms.  Follow up in 4 weeks for reevaluation.    Asthma, Adult Asthma is a condition of the lungs in which the airways tighten and narrow. Asthma can make it hard to breathe. Asthma cannot be cured, but medicine and lifestyle changes can help control it. Asthma may be started (triggered) by:  Animal skin flakes (dander).  Dust.  Cockroaches.  Pollen.  Mold.  Smoke.  Cleaning products.  Hair sprays or aerosol sprays.  Paint fumes or strong smells.  Cold air, weather changes, and winds.  Crying or laughing hard.  Stress.  Certain medicines or drugs.  Foods, such as dried fruit, potato chips, and sparkling grape juice.  Infections or conditions (colds, flu).  Exercise.  Certain medical conditions or diseases.  Exercise or tiring activities.  Follow these instructions at home:  Take medicine as told by your doctor.  Use a peak flow meter as told by your doctor. A peak flow meter is a tool that measures how well the lungs are working.  Record and keep track of the peak flow meter's readings.  Understand and use the asthma action plan. An asthma action plan is a written plan for taking care of your asthma and treating your attacks.  To help prevent asthma attacks: ? Do not smoke. Stay away from secondhand smoke. ? Change your heating and air conditioning filter often. ? Limit your use of fireplaces and wood stoves. ? Get rid of pests (such as roaches and mice) and their droppings. ? Throw away plants if you see mold on them. ? Clean your floors. Dust regularly. Use cleaning products that do not smell. ? Have someone vacuum when you are not home. Use a  vacuum cleaner with a HEPA filter if possible. ? Replace carpet with wood, tile, or vinyl flooring. Carpet can trap animal skin flakes and dust. ? Use allergy-proof pillows, mattress covers, and box spring covers. ? Wash bed sheets and blankets every week in hot water and dry them in a dryer. ? Use blankets that are made of polyester or cotton. ? Clean bathrooms and kitchens with bleach. If possible, have someone repaint the walls in these rooms with mold-resistant paint. Keep out of the rooms that are being cleaned and painted. ? Wash hands often. Contact a doctor if:  You have make a whistling sound when breaking (wheeze), have shortness of breath, or have a cough even if taking medicine to prevent attacks.  The colored mucus you cough up (sputum) is thicker than usual.  The colored mucus you cough up changes from clear or white to yellow, green, gray, or bloody.  You have problems from the medicine you are taking such as: ? A rash. ? Itching. ? Swelling. ? Trouble breathing.  You need reliever medicines more than 2-3 times a week.  Your peak flow measurement is still at 50-79% of your personal best after following the action plan for 1 hour.  You have a fever. Get help right away if:  You seem to be worse and are not responding to medicine during an asthma attack.  You are short of breath even at rest.  You get short of breath when doing very little activity.  You have trouble eating, drinking, or talking.  You have chest pain.  You have a fast heartbeat.  Your lips or fingernails start to turn blue.  You are light-headed, dizzy, or faint.  Your peak flow is less than 50% of your personal best. This information is not intended to replace advice given to you by your health care provider. Make sure you discuss any questions you have with your health care provider. Document Released: 01/09/2008 Document Revised: 12/29/2015 Document Reviewed: 02/19/2013 Elsevier  Interactive Patient Education  2017 [employer].  IF you received an x-ray today, you will receive an invoice from [facility]. Please contact [facility] at [phone] with questions or concerns regarding your invoice.   IF you received labwork today, you will receive an invoice from [place]. Please contact [facility] at [phone] with questions or concerns regarding your invoice.   Our billing staff will not be able to assist you with questions regarding bills from these companies.  You will be contacted with the lab results as soon as they are available. The fastest way to get your results is to activate your My Chart account. Instructions are located on the last page of this paperwork. If you have not heard from [place] regarding the results in 2 weeks, please contact this office.

## 2018-01-03 ENCOUNTER — Encounter: Payer: Self-pay | Admitting: Physician Assistant

## 2018-01-03 MED ORDER — RANITIDINE HCL 75 MG PO TABS: 75.0000 mg | ORAL_TABLET | Freq: Two times a day (BID) | ORAL | 1 refills | Status: AC | PRN

## 2018-01-29 ENCOUNTER — Ambulatory Visit: Payer: Commercial Managed Care - PPO | Admitting: Physician Assistant

## 2018-07-05 ENCOUNTER — Other Ambulatory Visit: Payer: Self-pay | Admitting: Physician Assistant

## 2018-07-05 DIAGNOSIS — J4540 Moderate persistent asthma, uncomplicated: Secondary | ICD-10-CM

## 2018-07-07 NOTE — Telephone Encounter (Signed)
Need DX Code.  Brad CoreBrittany  [place], [place], last provider.  LOV  01/01/18.

## 2018-11-28 ENCOUNTER — Ambulatory Visit: Payer: Commercial Managed Care - PPO | Admitting: Family Medicine

## 2018-12-22 IMAGING — DX DG ABDOMEN 1V
2 series · 2 of 2 positions shown · non-contrast
Comparison: CT 08/06/2013

CLINICAL DATA: Constipation for several years.

EXAM:
ABDOMEN - 1 VIEW

[abdomen kub (1 of 2)]
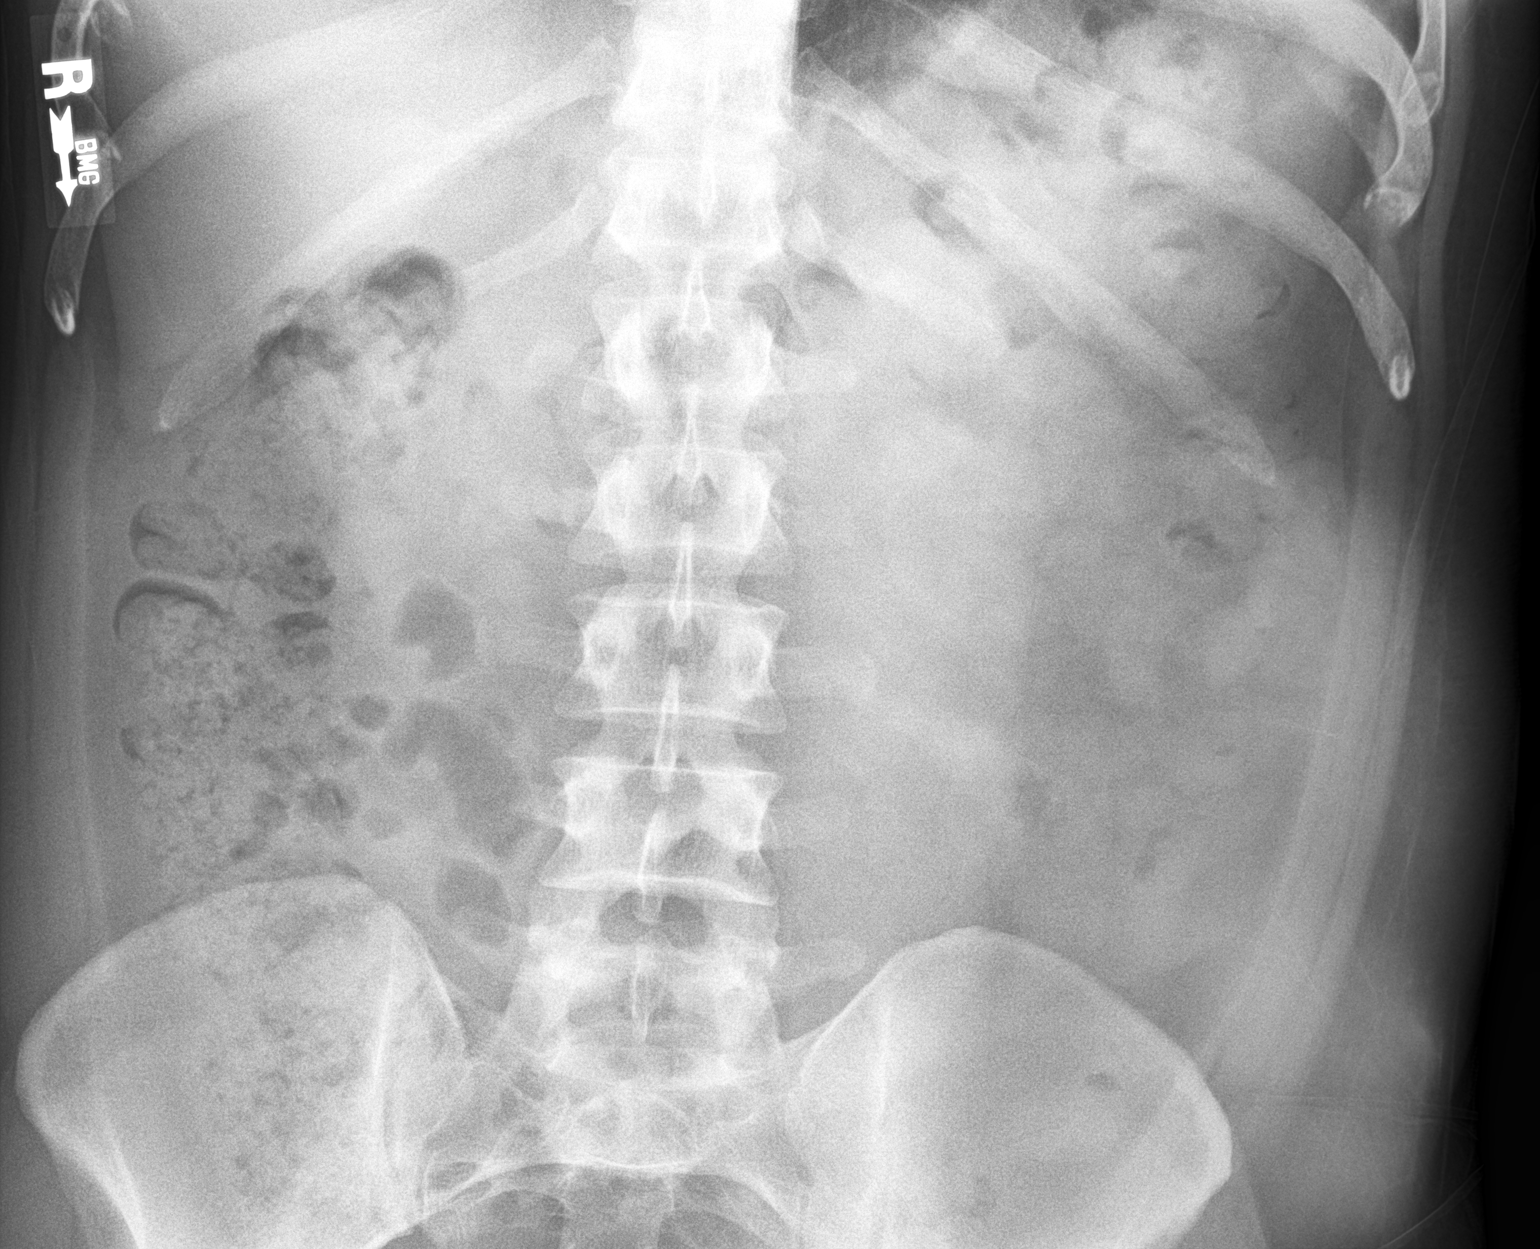

[abdomen kub (2 of 2)]
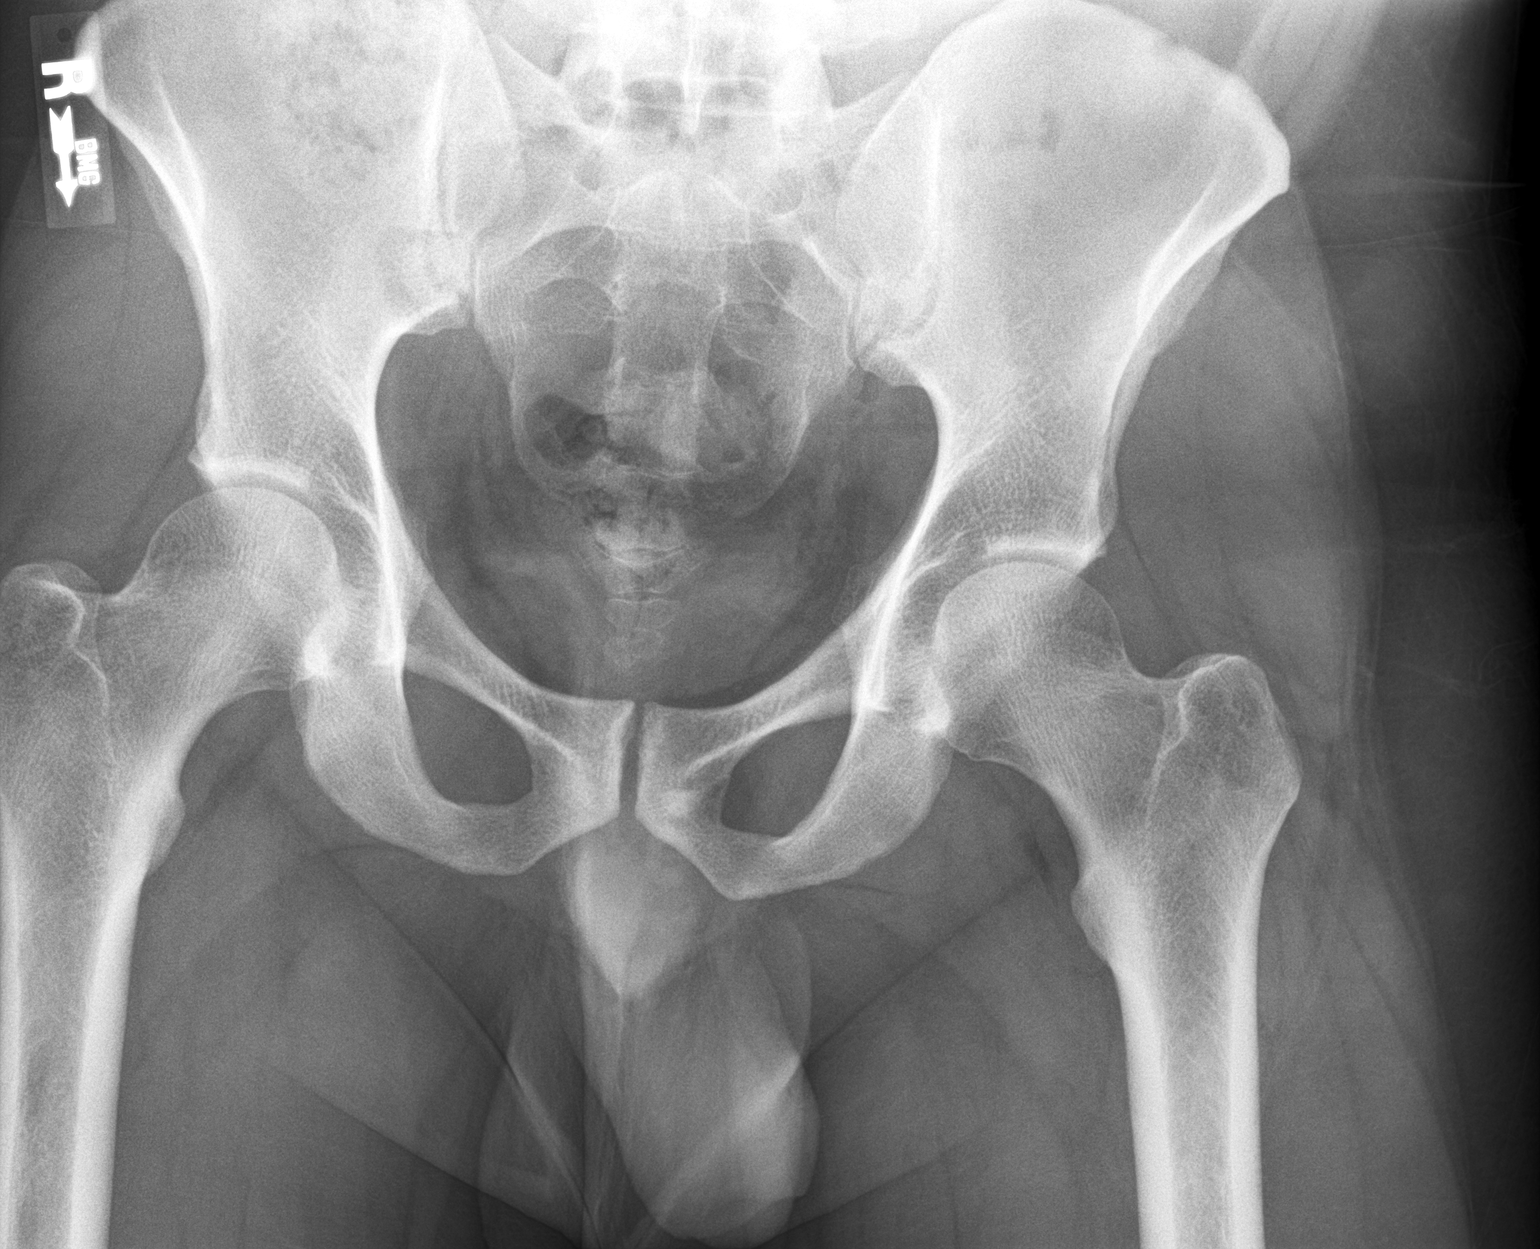

[2 of 2 positions shown; findings below may reference images not displayed]

FINDINGS: Moderate volume stool in the ascending colon. Normal volume in the
transverse and descending colon. Small volume stool in the rectum.
No dilated loops large or small bowel. No pathologic calcifications.
No organomegaly. No osseous abnormality
IMPRESSION: Moderate volume stool in the RIGHT colon.

## 2019-06-17 DIAGNOSIS — R5381 Other malaise: Secondary | ICD-10-CM | POA: Insufficient documentation

## 2019-06-17 DIAGNOSIS — K219 Gastro-esophageal reflux disease without esophagitis: Secondary | ICD-10-CM | POA: Insufficient documentation

## 2019-06-17 DIAGNOSIS — K589 Irritable bowel syndrome without diarrhea: Secondary | ICD-10-CM | POA: Insufficient documentation

## 2019-06-17 DIAGNOSIS — R5383 Other fatigue: Secondary | ICD-10-CM | POA: Insufficient documentation

## 2019-06-17 DIAGNOSIS — R1310 Dysphagia, unspecified: Secondary | ICD-10-CM | POA: Insufficient documentation

## 2020-11-16 ENCOUNTER — Other Ambulatory Visit (HOSPITAL_BASED_OUTPATIENT_CLINIC_OR_DEPARTMENT_OTHER): Payer: Self-pay | Admitting: Nurse Practitioner

## 2020-11-16 DIAGNOSIS — R112 Nausea with vomiting, unspecified: Secondary | ICD-10-CM | POA: Insufficient documentation

## 2020-11-16 DIAGNOSIS — K5900 Constipation, unspecified: Secondary | ICD-10-CM | POA: Insufficient documentation

## 2020-11-16 DIAGNOSIS — K623 Rectal prolapse: Secondary | ICD-10-CM | POA: Insufficient documentation

## 2020-11-16 DIAGNOSIS — R634 Abnormal weight loss: Secondary | ICD-10-CM | POA: Insufficient documentation

## 2020-11-17 ENCOUNTER — Ambulatory Visit (HOSPITAL_BASED_OUTPATIENT_CLINIC_OR_DEPARTMENT_OTHER): Payer: Commercial Managed Care - PPO | Admitting: Nurse Practitioner

## 2020-12-14 ENCOUNTER — Encounter (HOSPITAL_BASED_OUTPATIENT_CLINIC_OR_DEPARTMENT_OTHER): Payer: Self-pay | Admitting: Nurse Practitioner

## 2022-07-10 ENCOUNTER — Emergency Department (HOSPITAL_COMMUNITY): Payer: Commercial Managed Care - PPO

## 2022-07-10 ENCOUNTER — Encounter (HOSPITAL_COMMUNITY): Payer: Self-pay

## 2022-07-10 ENCOUNTER — Emergency Department (HOSPITAL_COMMUNITY)
Admission: EM | Admit: 2022-07-10 | Discharge: 2022-07-10 | Discharge status: Against Medical Advice | Payer: Commercial Managed Care - PPO | Attending: Student | Admitting: Student

## 2022-07-10 DIAGNOSIS — R079 Chest pain, unspecified: Secondary | ICD-10-CM | POA: Insufficient documentation

## 2022-07-10 DIAGNOSIS — R0682 Tachypnea, not elsewhere classified: Secondary | ICD-10-CM | POA: Diagnosis not present

## 2022-07-10 DIAGNOSIS — Z1152 Encounter for screening for COVID-19: Secondary | ICD-10-CM | POA: Diagnosis not present

## 2022-07-10 DIAGNOSIS — R002 Palpitations: Secondary | ICD-10-CM | POA: Insufficient documentation

## 2022-07-10 DIAGNOSIS — Z5321 Procedure and treatment not carried out due to patient leaving prior to being seen by health care provider: Secondary | ICD-10-CM | POA: Insufficient documentation

## 2022-07-10 LAB — RESP PANEL BY RT-PCR (FLU A&B, COVID) ARPGX2
Influenza A by PCR: NEGATIVE
Influenza B by PCR: NEGATIVE
SARS Coronavirus 2 by RT PCR: NEGATIVE

## 2022-07-10 LAB — BASIC METABOLIC PANEL
Anion gap: 9 (ref 5–15)
BUN: 15 mg/dL (ref 6–20)
CO2: 26 mmol/L (ref 22–32)
Calcium: 9.1 mg/dL (ref 8.9–10.3)
Chloride: 106 mmol/L (ref 98–111)
Creatinine, Ser: 1.23 mg/dL (ref 0.61–1.24)
GFR, Estimated: >60 mL/min (ref >60)
Glucose, Bld: 102 mg/dL — ABNORMAL HIGH (ref 70–99)
Potassium: 3.5 mmol/L (ref 3.5–5.1)
Sodium: 141 mmol/L (ref 135–145)

## 2022-07-10 LAB — CBC WITH DIFFERENTIAL/PLATELET
Abs Immature Granulocytes: 0.02 10*3/uL (ref 0.00–0.07)
Basophils Absolute: 0.1 10*3/uL (ref 0.0–0.1)
Basophils Relative: 0 %
Eosinophils Absolute: 0.4 10*3/uL (ref 0.0–0.5)
Eosinophils Relative: 4 %
HCT: 48.4 % (ref 39.0–52.0)
Hemoglobin: 16.2 g/dL (ref 13.0–17.0)
Immature Granulocytes: 0 %
Lymphocytes Relative: 34 %
Lymphs Abs: 4.1 10*3/uL — ABNORMAL HIGH (ref 0.7–4.0)
MCH: 29.2 pg (ref 26.0–34.0)
MCHC: 33.5 g/dL (ref 30.0–36.0)
MCV: 87.4 fL (ref 80.0–100.0)
Monocytes Absolute: 0.8 10*3/uL (ref 0.1–1.0)
Monocytes Relative: 6 %
Neutro Abs: 6.7 10*3/uL (ref 1.7–7.7)
Neutrophils Relative %: 56 %
Platelets: 295 10*3/uL (ref 150–400)
RBC: 5.54 MIL/uL (ref 4.22–5.81)
RDW: 12.1 % (ref 11.5–15.5)
WBC: 12 10*3/uL — ABNORMAL HIGH (ref 4.0–10.5)
nRBC: 0 % (ref 0.0–0.2)

## 2022-07-10 LAB — TROPONIN I (HIGH SENSITIVITY): Troponin I (High Sensitivity): 2 ng/L (ref <18)

## 2022-07-10 LAB — CBG MONITORING, ED: Glucose-Capillary: 125 mg/dL — ABNORMAL HIGH (ref 70–99)

## 2022-07-10 LAB — TSH: TSH: 1.249 u[IU]/mL (ref 0.350–4.500)

## 2022-07-10 LAB — D-DIMER, QUANTITATIVE: D-Dimer, Quant: 0.58 ug/mL-FEU — ABNORMAL HIGH (ref 0.00–0.50)

## 2022-07-10 MED ORDER — ONDANSETRON 4 MG PO TBDP
4.0000 mg | ORAL_TABLET | Freq: Once | ORAL | Status: DC
  Filled 2022-07-10: qty 1

## 2022-07-10 MED ORDER — SODIUM CHLORIDE 0.9 % IV BOLUS: 1000.0000 mL | Freq: Once | INTRAVENOUS | Status: DC

## 2022-07-10 NOTE — ED Provider Triage Note (Signed)
Emergency Medicine Provider Triage Evaluation Note  Brad Davidson , a 36 y.o. male  was evaluated in triage.  Pt complains of chest pain and palpitations. States symptoms began this morning. Was having intermittent sharp pain to his left chest. 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 He then ate lunch and afterward started having severe left sided sharp chest pain. He endorses palpitations and lightheadedness. Endorses being ill will flu like symptoms last week. Denies syncope. Denies history of congenital heart defects, family hx of sudden death, recent long flights/drives/surgery/malignancy history of clots.  Review of Systems  Positive: See above Negative:   Physical Exam  BP (!) 153/120   Pulse (!) 138   Resp 12   SpO2 98%  Gen:   Awake, anxious  Resp:  Mildly tachypneic but can be directed to take slow deep breaths  MSK:   Moves extremities without difficulty  Other:  Tachycardia without murmur   Medical Decision Making  Medically screening exam initiated at 1:55 PM.  Appropriate orders placed.  Brad Davidson was informed that the remainder of the evaluation will be completed by another provider, this initial triage assessment does not replace that evaluation, and the importance of remaining in the ED until their evaluation is complete.     Brad [place], PA-C 07/10/22 1355

## 2022-07-10 NOTE — ED Triage Notes (Signed)
Pt arrived via POV, c/o left sided chest pain and SOB. States sick with a cold recently.
